# Patient Record
Sex: Female | Born: 2000 | Race: White | Hispanic: Yes | Marital: Single | Smoking: Never smoker
Health system: Southern US, Community
[De-identification: ages and names within clinical notes are randomized; demographics above are authoritative.]

---
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
## 2022-01-14 ENCOUNTER — Ambulatory Visit: Payer: PRIVATE HEALTH INSURANCE | Admitting: Dermatology

## 2022-02-19 ENCOUNTER — Ambulatory Visit (INDEPENDENT_AMBULATORY_CARE_PROVIDER_SITE_OTHER): Payer: PRIVATE HEALTH INSURANCE | Admitting: Dermatology

## 2022-02-19 ENCOUNTER — Other Ambulatory Visit: Payer: Self-pay

## 2022-02-19 DIAGNOSIS — L700 Acne vulgaris: Secondary | ICD-10-CM | POA: Diagnosis not present

## 2022-02-19 DIAGNOSIS — Z79899 Other long term (current) drug therapy: Secondary | ICD-10-CM | POA: Diagnosis not present

## 2022-02-19 MED ORDER — RETIN-A MICRO PUMP 0.08 % EX GEL: CUTANEOUS | 2 refills | Status: DC

## 2022-02-19 MED ORDER — SPIRONOLACTONE 50 MG PO TABS: 50.0000 mg | ORAL_TABLET | Freq: Every day | ORAL | 2 refills | Status: DC

## 2022-02-19 NOTE — Progress Notes (Signed)
? ?Follow-Up Visit ?  ?Subjective  ?Tara [name] is a 21 y.o. female who presents for the following: Acne (Patient here today for acne follow up at chest, back and face. Patient currently using Retin-A Micro 0.06% QHS, clindamycin/BP gel QAM, Azelex 20% cream BID and Panoxyl wash. Patient advises acne was improved but is currently flared with her period. ). ? ?Patient had some nausea and dizziness while on spironolactone 100 mg and doxycycline 20 mg.  ? ?The following portions of the chart were reviewed this encounter and updated as appropriate:  ? Allergies  Meds  Problems  Med Hx  Surg Hx  Fam Hx   ?  ? ?Review of Systems:  No other skin or systemic complaints except as noted in HPI or Assessment and Plan. ? ?Objective  ?Well appearing patient in no apparent distress; mood and affect are within normal limits. ? ?A focused examination was performed including face, neck, chest and back. Relevant physical exam findings are noted in the Assessment and Plan. ? ?face ?Face with trace open comedones, few inflammatory papules ?Back with scattered inflammatory papules ? ? ? ?Assessment & Plan  ?Acne vulgaris ?face ? ?Chronic and persistent condition with duration or expected duration over one year. Condition is bothersome/symptomatic for patient. Currently flared. ? ?BP 108/79 ? ?Start spironolactone 50 mg once daily at bedtime. Monitor for lightheadedness. ?Continue Retin-A Micro increasing to 0.08% at bedtime. ?Continue clindamycin/BP gel in the morning. ?Continue Azelex 20% cream twice daily.  ? ?Spironolactone can cause increased urination and cause blood pressure to decrease. Please watch for signs of lightheadedness and be cautious when changing position. It can sometimes cause breast tenderness or an irregular period in premenopausal women. It can also increase potassium. The increase in potassium usually is not a concern unless you are taking other medicines that also increase potassium, so please  be sure your doctor knows all of the other medications you are taking. This medication should not be taken by pregnant women.  This medicine should also not be taken together with sulfa drugs like Bactrim (trimethoprim/sulfamethexazole).  ? ?Topical retinoid medications like tretinoin/Retin-A, adapalene/Differin, tazarotene/Fabior, and Epiduo/Epiduo Forte can cause dryness and irritation when first started. Only apply a pea-sized amount to the entire affected area. Avoid applying it around the eyes, edges of mouth and creases at the nose. If you experience irritation, use a good moisturizer first and/or apply the medicine less often. If you are doing well with the medicine, you can increase how often you use it until you are applying every night. Be careful with sun protection while using this medication as it can make you sensitive to the sun. This medicine should not be used by pregnant women.  ? ? ?Tretinoin Microsphere (RETIN-A MICRO PUMP) 0.08 % GEL - face ?Apply nightly to entire face as tolerated. ? ?spironolactone (ALDACTONE) 50 MG tablet - face ?Take 1 tablet (50 mg total) by mouth daily. ? ?Related Medications ?AZELEX 20 % cream ?Apply topically 2 (two) times daily. After skin is thoroughly washed and patted dry, gently but thoroughly massage a thin film of azelaic acid cream into the affected area in the morning ? ?Clindamycin-Benzoyl Per, Refr, (DUAC) gel ?Apply a thin coat to the entire face QAM. ? ? ?Return in about 3 months (around 05/22/2022) for acne. ? ?Graciella Belton, RMA, am acting as scribe for Forest Gleason, MD . ? ?Documentation: I have reviewed the above documentation for accuracy and completeness, and I agree with the above. ? ?VIRGINA Casi Westerfeld,  MD ? ? ?

## 2022-02-19 NOTE — Patient Instructions (Addendum)
Start spironolactone 50 mg once daily at bedtime.  ?Continue Retin-A Micro increasing to 0.08% at bedtime. ?Continue clindamycin/BP gel in the morning. ?Continue Azelex 20% cream twice daily.  ? ?Spironolactone can cause increased urination and cause blood pressure to decrease. Please watch for signs of lightheadedness and be cautious when changing position. It can sometimes cause breast tenderness or an irregular period in premenopausal women. It can also increase potassium. The increase in potassium usually is not a concern unless you are taking other medicines that also increase potassium, so please be sure your doctor knows all of the other medications you are taking. This medication should not be taken by pregnant women.  This medicine should also not be taken together with sulfa drugs like Bactrim (trimethoprim/sulfamethexazole).  ? ?Topical retinoid medications like tretinoin/Retin-A, adapalene/Differin, tazarotene/Fabior, and Epiduo/Epiduo Forte can cause dryness and irritation when first started. Only apply a pea-sized amount to the entire affected area. Avoid applying it around the eyes, edges of mouth and creases at the nose. If you experience irritation, use a good moisturizer first and/or apply the medicine less often. If you are doing well with the medicine, you can increase how often you use it until you are applying every night. Be careful with sun protection while using this medication as it can make you sensitive to the sun. This medicine should not be used by pregnant women.  ? ?If You Need Anything After Your Visit ? ?If you have any questions or concerns for your doctor, please call our main line at [phone] and press option 4 to reach your doctor's medical assistant. If no one answers, please leave a voicemail as directed and we will return your call as soon as possible. Messages left after 4 pm will be answered the following business day.  ? ?You may also send [place] a message via MyChart. We  typically respond to MyChart messages within 1-2 business days. ? ?For prescription refills, please ask your pharmacy to contact our office. Our fax number is [phone]. ? ?If you have an urgent issue when the clinic is closed that cannot wait until the next business day, you can page your doctor at the number below.   ? ?Please note that while we do our best to be available for urgent issues outside of office hours, we are not available 24/7.  ? ?If you have an urgent issue and are unable to reach [place], you may choose to seek medical care at your doctor's office, retail clinic, urgent care center, or emergency room. ? ?If you have a medical emergency, please immediately call 911 or go to the emergency department. ? ?Pager Numbers ? ?- Dr. Gwen Pounds: [phone] ? ?- Dr. Neale Burly: [phone] ? ?- Dr. Roseanne Reno: [phone] ? ?In the event of inclement weather, please call our main line at [phone] for an update on the status of any delays or closures. ? ?Dermatology Medication Tips: ?Please keep the boxes that topical medications come in in order to help keep track of the instructions about where and how to use these. Pharmacies typically print the medication instructions only on the boxes and not directly on the medication tubes.  ? ?If your medication is too expensive, please contact our office at [phone] option 4 or send [place] a message through MyChart.  ? ?We are unable to tell what your co-pay for medications will be in advance as this is different depending on your insurance coverage. However, we may be able to find a substitute medication at lower  cost or fill out paperwork to get insurance to cover a needed medication.  ? ?If a prior authorization is required to get your medication covered by your insurance company, please allow [place] 1-2 business days to complete this process. ? ?Drug prices often vary depending on where the prescription is filled and some pharmacies may offer cheaper prices. ? ?The  website www.goodrx.com contains coupons for medications through different pharmacies. The prices here do not account for what the cost may be with help from insurance (it may be cheaper with your insurance), but the website can give you the price if you did not use any insurance.  ?- You can print the associated coupon and take it with your prescription to the pharmacy.  ?- You may also stop by our office during regular business hours and pick up a GoodRx coupon card.  ?- If you need your prescription sent electronically to a different pharmacy, notify our office through [facility] or by phone at [phone] option 4. ? ? ? ? ?Si Usted Necesita Algo Despu?s de Su Visita ? ?Tambi?n puede enviarnos un mensaje a trav?s de MyChart. Por lo general respondemos a los mensajes de MyChart en el transcurso de 1 a 2 d?as h?biles. ? ?Para renovar recetas, por favor pida a su farmacia que se ponga en contacto con nuestra oficina. Nuestro n?mero de fax es el [phone]. ? ?Si tiene un asunto urgente cuando la cl?nica est? cerrada y que no puede esperar hasta el siguiente d?a h?bil, puede llamar/localizar a su doctor(a) al n?mero que aparece a continuaci?n.  ? ?Por favor, tenga en cuenta que aunque hacemos todo lo posible para estar disponibles para asuntos urgentes fuera del horario de oficina, no estamos disponibles las 24 horas del d?a, los 7 d?as de la semana.  ? ?Si tiene un problema urgente y no puede comunicarse con nosotros, puede optar por buscar atenci?n m?dica  en el consultorio de su doctor(a), en una cl?nica privada, en un centro de atenci?n urgente o en una sala de emergencias. ? ?Si tiene Radio broadcast assistant m?dica, por favor llame inmediatamente al 911 o vaya a la sala de emergencias. ? ?N?meros de b?per ? ?- Dr. Gwen Pounds: [phone] ? ?- Dra. Moye: [phone] ? ?- Dra. Roseanne Reno: [phone] ? ?En caso de inclemencias del tiempo, por favor llame a nuestra l?nea principal al [phone] para una  actualizaci?n sobre el estado de cualquier retraso o cierre. ? ?Consejos para la medicaci?n en dermatolog?a: ?Por favor, guarde las cajas en las que vienen los medicamentos de uso t?pico para ayudarle a seguir las instrucciones sobre d?nde y c?mo usarlos. Las farmacias generalmente imprimen las instrucciones del medicamento s?lo en las cajas y no directamente en los tubos del [place].  ? ?Si su medicamento es muy caro, por favor, p?ngase en contacto con Rolm Gala llamando al [phone] y presione la opci?n 4 o env?enos un mensaje a trav?s de MyChart.  ? ?No podemos decirle cu?l ser? su copago por los medicamentos por adelantado ya que esto es diferente dependiendo de la cobertura de su seguro. Sin embargo, es posible que podamos encontrar un medicamento sustituto a Audiological scientist un formulario para que el seguro cubra el medicamento que se considera necesario.  ? ?Si se requiere Neomia Dear autorizaci?n previa para que su compa??a de seguros [place] su medicamento, por favor perm?tanos de 1 a 2 d?as h?biles para completar este proceso. ? ?Los precios de los medicamentos var?an con frecuencia dependiendo del Environmental consultant de d?nde se surte  la receta y alguna farmacias pueden ofrecer precios m?s baratos. ? ?El sitio web www.goodrx.com tiene cupones para medicamentos de Airline pilot. Los precios aqu? no tienen en cuenta lo que podr?a costar con la ayuda del seguro (puede ser m?s barato con su seguro), pero el sitio web puede darle el precio si no utiliz? ning?n seguro.  ?- Puede imprimir el cup?n correspondiente y llevarlo con su receta a la farmacia.  ?- Tambi?n puede pasar por nuestra oficina durante el horario de atenci?n regular y recoger una tarjeta de cupones de GoodRx.  ?- Si necesita que su receta se env?e electr?nicamente a Chiropodist, informe a nuestra oficina a trav?s de MyChart de [place] o por tel?fono llamando al [phone] y presione la opci?n 4. ? ?

## 2022-02-22 ENCOUNTER — Encounter: Payer: Self-pay | Admitting: Dermatology

## 2022-02-24 ENCOUNTER — Other Ambulatory Visit: Payer: Self-pay | Admitting: Dermatology

## 2022-02-24 DIAGNOSIS — L700 Acne vulgaris: Secondary | ICD-10-CM

## 2022-05-25 ENCOUNTER — Ambulatory Visit (INDEPENDENT_AMBULATORY_CARE_PROVIDER_SITE_OTHER): Payer: Medicaid Other | Admitting: Dermatology

## 2022-05-25 VITALS — BP 120/66 | HR 71

## 2022-05-25 DIAGNOSIS — L700 Acne vulgaris: Secondary | ICD-10-CM | POA: Diagnosis not present

## 2022-05-25 MED ORDER — DOXYCYCLINE MONOHYDRATE 100 MG PO CAPS: ORAL_CAPSULE | ORAL | 3 refills | Status: DC

## 2022-05-25 MED ORDER — SPIRONOLACTONE 100 MG PO TABS: 100.0000 mg | ORAL_TABLET | Freq: Every day | ORAL | 3 refills | Status: DC

## 2022-05-25 NOTE — Patient Instructions (Addendum)
Benzoyl peroxide can cause dryness and irritation of the skin. It can also bleach fabric. When used together with Aczone (dapsone) cream, it can stain the skin orange.  Doxycycline should be taken with food to prevent nausea. Do not lay down for 30 minutes after taking. Be cautious with sun exposure and use good sun protection while on this medication. Pregnant women should not take this medication.   Spironolactone can cause increased urination and cause blood pressure to decrease. Please watch for signs of lightheadedness and be cautious when changing position. It can sometimes cause breast tenderness or an irregular period in premenopausal women. It can also increase potassium. The increase in potassium usually is not a concern unless you are taking other medicines that also increase potassium, so please be sure your doctor knows all of the other medications you are taking. This medication should not be taken by pregnant women.  This medicine should also not be taken together with sulfa drugs like Bactrim (trimethoprim/sulfamethexazole).   Topical retinoid medications like tretinoin/Retin-A, adapalene/Differin, tazarotene/Fabior, and Epiduo/Epiduo Forte can cause dryness and irritation when first started. Only apply a pea-sized amount to the entire affected area. Avoid applying it around the eyes, edges of mouth and creases at the nose. If you experience irritation, use a good moisturizer first and/or apply the medicine less often. If you are doing well with the medicine, you can increase how often you use it until you are applying every night. Be careful with sun protection while using this medication as it can make you sensitive to the sun. This medicine should not be used by pregnant women.     If You Need Anything After Your Visit  If you have any questions or concerns for your doctor, please call our main line at [phone] and press option 4 to reach your doctor's medical assistant. If no one  answers, please leave a voicemail as directed and we will return your call as soon as possible. Messages left after 4 pm will be answered the following business day.   You may also send [place] a message via MyChart. We typically respond to MyChart messages within 1-2 business days.  For prescription refills, please ask your pharmacy to contact our office. Our fax number is [phone].  If you have an urgent issue when the clinic is closed that cannot wait until the next business day, you can page your doctor at the number below.    Please note that while we do our best to be available for urgent issues outside of office hours, we are not available 24/7.   If you have an urgent issue and are unable to reach [place], you may choose to seek medical care at your doctor's office, retail clinic, urgent care center, or emergency room.  If you have a medical emergency, please immediately call 911 or go to the emergency department.  Pager Numbers  - Dr. Gwen Pounds: [phone]  - Dr. Neale Burly: [phone]  - Dr. Roseanne Reno: [phone]  In the event of inclement weather, please call our main line at [phone] for an update on the status of any delays or closures.  Dermatology Medication Tips: Please keep the boxes that topical medications come in in order to help keep track of the instructions about where and how to use these. Pharmacies typically print the medication instructions only on the boxes and not directly on the medication tubes.   If your medication is too expensive, please contact our office at [phone] option 4 or send [place]  a message through [place].   We are unable to tell what your co-pay for medications will be in advance as this is different depending on your insurance coverage. However, we may be able to find a substitute medication at lower cost or fill out paperwork to get insurance to cover a needed medication.   If a prior authorization is required to get your medication covered  by your insurance company, please allow [place] 1-2 business days to complete this process.  Drug prices often vary depending on where the prescription is filled and some pharmacies may offer cheaper prices.  The website www.goodrx.com contains coupons for medications through different pharmacies. The prices here do not account for what the cost may be with help from insurance (it may be cheaper with your insurance), but the website can give you the price if you did not use any insurance.  - You can print the associated coupon and take it with your prescription to the pharmacy.  - You may also stop by our office during regular business hours and pick up a GoodRx coupon card.  - If you need your prescription sent electronically to a different pharmacy, notify our office through [facility] or by phone at [phone] option 4.     Si Usted Necesita Algo Despus de Su Visita  Tambin puede enviarnos un mensaje a travs de Pharmacist, community. Por lo general respondemos a los mensajes de MyChart en el transcurso de 1 a 2 das hbiles.  Para renovar recetas, por favor pida a su farmacia que se ponga en contacto con nuestra oficina. Harland Dingwall de fax es [place] [phone].  Si tiene un asunto urgente cuando la clnica est cerrada y que no puede esperar hasta el siguiente da hbil, puede llamar/localizar a su doctor(a) al nmero que aparece a continuacin.   Por favor, tenga en cuenta que aunque hacemos todo lo posible para estar disponibles para asuntos urgentes fuera del horario de War, no estamos disponibles las 24 horas del da, los 7 das de la [place].   Si tiene un problema urgente y no puede comunicarse con nosotros, puede optar por buscar atencin mdica  en el consultorio de su doctor(a), en una clnica privada, en un centro de atencin urgente o en una sala de emergencias.  Si tiene Engineering geologist, por favor llame inmediatamente al 911 o vaya a la sala de emergencias.  Nmeros de  bper  - Dr. Nehemiah Massed: [phone]  - Dra. Moye: [phone]  - Dra. Nicole Kindred: [phone]  En caso de inclemencias del [place], por favor llame a Johnsie Kindred principal al [phone] para una actualizacin sobre el [place] de cualquier retraso o cierre.  Consejos para la medicacin en dermatologa: Por favor, guarde las cajas en las que vienen los medicamentos de uso tpico para ayudarle a seguir las instrucciones sobre dnde y cmo usarlos. Las farmacias generalmente imprimen las instrucciones del medicamento slo en las cajas y no directamente en los tubos del [place].   Si su medicamento es muy caro, por favor, pngase en contacto con Zigmund Daniel llamando al [phone] y presione la opcin 4 o envenos un mensaje a travs de Pharmacist, community.   No podemos decirle cul ser su copago por los medicamentos por adelantado ya que esto es diferente dependiendo de la cobertura de su seguro. Sin embargo, es posible que podamos encontrar un medicamento sustituto a Electrical engineer un formulario para que el seguro cubra el medicamento que se considera necesario.   Si se requiere  una autorizacin previa para que su compaa de seguros [place] su medicamento, por favor permtanos de 1 a 2 das hbiles para completar este proceso.  Los precios de los medicamentos varan con frecuencia dependiendo del Environmental consultant de dnde se surte la receta y alguna farmacias pueden ofrecer precios ms baratos.  El sitio web www.goodrx.com tiene cupones para medicamentos de Airline pilot. Los precios aqu no tienen en cuenta lo que podra costar con la ayuda del seguro (puede ser ms barato con su seguro), pero el sitio web puede darle el precio si no utiliz Research scientist (physical sciences).  - Puede imprimir el cupn correspondiente y llevarlo con su receta a la farmacia.  - Tambin puede pasar por nuestra oficina durante el horario de atencin regular y Charity fundraiser una tarjeta de cupones de GoodRx.  - Si necesita que su receta se  enve electrnicamente a una farmacia diferente, informe a nuestra oficina a travs de MyChart de [place] o por telfono llamando al [phone] y presione la opcin 4.

## 2022-05-25 NOTE — Progress Notes (Signed)
Follow-Up Visit   Subjective  Tara [name] is a 21 y.o. female who presents for the following: Acne (Patient currently using Spironolactone 50mg  po QD, Retin A 0.08% QHS, Clindamycin/BP gel QAM, and Azelex 20% BID. Patient tolerating medications well with no s/e. Patient continues to flare off and on especially around menses).    The following portions of the chart were reviewed this encounter and updated as appropriate:       Review of Systems:  No other skin or systemic complaints except as noted in HPI or Assessment and Plan.  Objective  Well appearing patient in no apparent distress; mood and affect are within normal limits.  A focused examination was performed including the face, chest, and back. Relevant physical exam findings are noted in the Assessment and Plan.  Face Multiple inflamed comedones on the forehead, cheeks, and chin. Few scattered inflammatory papules on the temples and cheeks. Violaceous papules on the back with one pustule and scattered comedones.    Assessment & Plan  Acne vulgaris Face  Chronic and persistent condition with duration or expected duration over one year. Condition is bothersome/symptomatic for patient. Currently flared.  Increase Spironolactone to 100mg  po QD. Spironolactone can cause increased urination and cause blood pressure to decrease. Please watch for signs of lightheadedness and be cautious when changing position. It can sometimes cause breast tenderness or an irregular period in premenopausal women. It can also increase potassium. The increase in potassium usually is not a concern unless you are taking other medicines that also increase potassium, so please be sure your doctor knows all of the other medications you are taking. This medication should not be taken by pregnant women.  This medicine should also not be taken together with sulfa drugs like Bactrim (trimethoprim/sulfamethexazole).   Continue Azelex cream  QD/BID.    Continue Duac gel QAM. Benzoyl peroxide can cause dryness and irritation of the skin. It can also bleach fabric. When used together with Aczone (dapsone) cream, it can stain the skin orange.   Continue Retin A 0.08% gel QHS. Topical retinoid medications like tretinoin/Retin-A, adapalene/Differin, tazarotene/Fabior, and Epiduo/Epiduo Forte can cause dryness and irritation when first started. Only apply a pea-sized amount to the entire affected area. Avoid applying it around the eyes, edges of mouth and creases at the nose. If you experience irritation, use a good moisturizer first and/or apply the medicine less often. If you are doing well with the medicine, you can increase how often you use it until you are applying every night. Be careful with sun protection while using this medication as it can make you sensitive to the sun. This medicine should not be used by pregnant women.   Start Doxycycline 100mg  po QD. Doxycycline should be taken with food to prevent nausea. Do not lay down for 30 minutes after taking. Be cautious with sun exposure and use good sun protection while on this medication. Pregnant women should not take this medication.   Continue Panoxyl OTC wash daily in the shower.   Consider Isotretinoin ion f/up if not improving with adding Doxycycline and increasing Spironolactone.  spironolactone (ALDACTONE) 100 MG tablet - Face Take 1 tablet (100 mg total) by mouth daily.  doxycycline (MONODOX) 100 MG capsule - Face Take one cap po QHS with food.  Related Medications AZELEX 20 % cream Apply topically 2 (two) times daily. After skin is thoroughly washed and patted dry, gently but thoroughly massage a thin film of azelaic acid cream into the affected area in  the morning  Clindamycin-Benzoyl Per, Refr, (DUAC) gel Apply a thin coat to the entire face QAM.  Tretinoin Microsphere (RETIN-A MICRO PUMP) 0.08 % GEL Apply nightly to entire face as tolerated.   Return in about 10  weeks (around 08/03/2022) for acne follow up .  Maylene Roes, CMA, am acting as scribe for Willeen Niece, MD .  Documentation: I have reviewed the above documentation for accuracy and completeness, and I agree with the above.  Willeen Niece MD

## 2022-07-27 ENCOUNTER — Ambulatory Visit: Payer: Medicaid Other | Admitting: Dermatology

## 2022-11-25 ENCOUNTER — Encounter: Payer: Self-pay | Admitting: Obstetrics and Gynecology

## 2022-11-25 ENCOUNTER — Ambulatory Visit (INDEPENDENT_AMBULATORY_CARE_PROVIDER_SITE_OTHER): Payer: Medicaid Other | Admitting: Obstetrics and Gynecology

## 2022-11-25 VITALS — BP 119/78 | HR 62 | Ht 66.0 in | Wt 168.6 lb

## 2022-11-25 DIAGNOSIS — N921 Excessive and frequent menstruation with irregular cycle: Secondary | ICD-10-CM

## 2022-11-25 DIAGNOSIS — Z7689 Persons encountering health services in other specified circumstances: Secondary | ICD-10-CM

## 2022-11-25 DIAGNOSIS — N946 Dysmenorrhea, unspecified: Secondary | ICD-10-CM | POA: Diagnosis not present

## 2022-11-25 MED ORDER — DESOGESTREL-ETHINYL ESTRADIOL 0.15-0.02/0.01 MG (21/5) PO TABS: 1.0000 | ORAL_TABLET | Freq: Every day | ORAL | 3 refills | Status: DC

## 2022-11-25 NOTE — Progress Notes (Signed)
Patient presents today to discuss menorrhagia. She states over the past three years she has had two menstrual cycles per month, very heavy along with sever cramping. Patient has never been on birth control. No additional concerns.

## 2022-11-25 NOTE — Progress Notes (Signed)
HPI:      [name] is a 21 y.o. G0P0000 who LMP was Patient's last menstrual period was 11/22/2022 (exact date).  Subjective:   She presents today stating that over the last 3 years she has had 2 menstrual cycles per month.  Each 1 lasts approximately 8 days.  She is understandably tired of this bleeding.  She is sexually active.  She uses condoms for birth control.    Hx: The following portions of the patient's history were reviewed and updated as appropriate:             She  has no past medical history on file. She does not have a problem list on file. She  has no past surgical history on file. Her family history is not on file. She  reports that she has never smoked. She does not have any smokeless tobacco history on file. She reports that she does not drink alcohol and does not use drugs. She has a current medication list which includes the following prescription(s): azelex, clindamycin-benzoyl per (refr), desogestrel-ethinyl estradiol, doxycycline, retin-a, spironolactone, and retin-a micro pump. She has No Known Allergies.       Review of Systems:  Review of Systems  Constitutional: Denied constitutional symptoms, night sweats, recent illness, fatigue, fever, insomnia and weight loss.  Eyes: Denied eye symptoms, eye pain, photophobia, vision change and visual disturbance.  Ears/Nose/Throat/Neck: Denied ear, nose, throat or neck symptoms, hearing loss, nasal discharge, sinus congestion and sore throat.  Cardiovascular: Denied cardiovascular symptoms, arrhythmia, chest pain/pressure, edema, exercise intolerance, orthopnea and palpitations.  Respiratory: Denied pulmonary symptoms, asthma, pleuritic pain, productive sputum, cough, dyspnea and wheezing.  Gastrointestinal: Denied, gastro-esophageal reflux, melena, nausea and vomiting.  Genitourinary: See HPI for additional information.  Musculoskeletal: Denied musculoskeletal symptoms, stiffness, swelling, muscle weakness  and myalgia.  Dermatologic: Denied dermatology symptoms, rash and scar.  Neurologic: Denied neurology symptoms, dizziness, headache, neck pain and syncope.  Psychiatric: Denied psychiatric symptoms, anxiety and depression.  Endocrine: Denied endocrine symptoms including hot flashes and night sweats.   Meds:   Current Outpatient Medications on File Prior to Visit  Medication Sig Dispense Refill   AZELEX 20 % cream Apply topically 2 (two) times daily. After skin is thoroughly washed and patted dry, gently but thoroughly massage a thin film of azelaic acid cream into the affected area in the morning 30 g 5   Clindamycin-Benzoyl Per, Refr, (DUAC) gel Apply a thin coat to the entire face QAM. 45 g 3   doxycycline (MONODOX) 100 MG capsule Take one cap po QHS with food. 30 capsule 3   RETIN-A 0.025 % cream Apply topically at bedtime. 45 g 1   spironolactone (ALDACTONE) 100 MG tablet Take 1 tablet (100 mg total) by mouth daily. 30 tablet 3   Tretinoin Microsphere (RETIN-A MICRO PUMP) 0.08 % GEL Apply nightly to entire face as tolerated. 50 g 2   No current facility-administered medications on file prior to visit.      Objective:     Vitals:   11/25/22 1135  BP: 119/78  Pulse: 62   Filed Weights   11/25/22 1135  Weight: 168 lb 9.6 oz (76.5 kg)                        Assessment:    G0P0000 There are no problems to display for this patient.    1. Establishing care with new doctor, encounter for   2. Dysmenorrhea   3.  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  OCPs for cycle control.  I have discussed the rationale for this choice in detail with the patient  OCPs The risks /benefits of OCPs have been explained to the patient in detail.  Product literature has been given to her where appropriate.  I have instructed her in the use of OCPs.  I have explained to the patient that OCPs are not as effective for birth control during  the first month of use, and that another form of contraception should be used during this time.  Both first-day start and Sunday start have been explained.  The risks and benefits of each was discussed.  She has been made aware of  the fact that in rare circumstances, other medications may affect the efficacy of OCPs.  I have answered all of her questions, and I believe that she has an understanding of the effectiveness and use of OCPs.  Orders No orders of the defined types were placed in this encounter.    Meds ordered this encounter  Medications   desogestrel-ethinyl estradiol (MIRCETTE) 0.15-0.02/0.01 MG (21/5) tablet    Sig: Take 1 tablet by mouth at bedtime.    Dispense:  28 tablet    Refill:  3      F/U  Return in about 3 months (around 02/24/2023). I spent 21 minutes involved in the care of this patient preparing to see the patient by obtaining and reviewing her medical history (including labs, imaging tests and prior procedures), documenting clinical information in the electronic health record (EHR), counseling and coordinating care plans, writing and sending prescriptions, ordering tests or procedures and in direct communicating with the patient and medical staff discussing pertinent items from her history and physical exam.  Elonda Husky, M.D. 11/25/2022 12:08 PM

## 2022-12-04 ENCOUNTER — Encounter: Payer: Medicaid Other | Admitting: Licensed Practical Nurse

## 2023-02-02 ENCOUNTER — Telehealth: Payer: Self-pay | Admitting: Obstetrics and Gynecology

## 2023-02-02 NOTE — Telephone Encounter (Signed)
Patient is rescheduled for 3/12 with DJE

## 2023-02-02 NOTE — Telephone Encounter (Signed)
Lm for patient to call office back to r/s appt on 02/24/23 with DJE he will be out of the office that week.

## 2023-02-24 ENCOUNTER — Ambulatory Visit: Payer: Medicaid Other | Admitting: Obstetrics and Gynecology

## 2023-03-02 ENCOUNTER — Encounter: Payer: Self-pay | Admitting: Obstetrics and Gynecology

## 2023-03-02 ENCOUNTER — Ambulatory Visit (INDEPENDENT_AMBULATORY_CARE_PROVIDER_SITE_OTHER): Payer: Medicaid Other | Admitting: Obstetrics and Gynecology

## 2023-03-02 VITALS — BP 117/77 | HR 67 | Ht 66.0 in | Wt 170.2 lb

## 2023-03-02 DIAGNOSIS — N921 Excessive and frequent menstruation with irregular cycle: Secondary | ICD-10-CM

## 2023-03-02 DIAGNOSIS — Z3041 Encounter for surveillance of contraceptive pills: Secondary | ICD-10-CM

## 2023-03-02 DIAGNOSIS — N946 Dysmenorrhea, unspecified: Secondary | ICD-10-CM

## 2023-03-02 MED ORDER — DESOGESTREL-ETHINYL ESTRADIOL 0.15-0.02/0.01 MG (21/5) PO TABS: 1.0000 | ORAL_TABLET | Freq: Every day | ORAL | 11 refills | Status: DC

## 2023-03-02 NOTE — Progress Notes (Signed)
HPI:      Tara [name] is a 22 y.o. G0P0000 who LMP was Patient's last menstrual period was 02/27/2023.  Subjective:   She presents today for follow-up of OCPs.  She was having heavy irregular bleeding.  She has begun taking OCPs and they are working well for her.  She now has normal regular cycles.  She is taking them daily and says it is going well taking them before bed.  She would like to continue OCPs.    Hx: The following portions of the patient's history were reviewed and updated as appropriate:             She  has no past medical history on file. She does not have a problem list on file. She  has no past surgical history on file. Her family history is not on file. She  reports that she has never smoked. She does not have any smokeless tobacco history on file. She reports that she does not drink alcohol and does not use drugs. She has a current medication list which includes the following prescription(s): azelex, clindamycin-benzoyl per (refr), doxycycline, retin-a, spironolactone, retin-a micro pump, and desogestrel-ethinyl estradiol. She has No Known Allergies.       Review of Systems:  Review of Systems  Constitutional: Denied constitutional symptoms, night sweats, recent illness, fatigue, fever, insomnia and weight loss.  Eyes: Denied eye symptoms, eye pain, photophobia, vision change and visual disturbance.  Ears/Nose/Throat/Neck: Denied ear, nose, throat or neck symptoms, hearing loss, nasal discharge, sinus congestion and sore throat.  Cardiovascular: Denied cardiovascular symptoms, arrhythmia, chest pain/pressure, edema, exercise intolerance, orthopnea and palpitations.  Respiratory: Denied pulmonary symptoms, asthma, pleuritic pain, productive sputum, cough, dyspnea and wheezing.  Gastrointestinal: Denied, gastro-esophageal reflux, melena, nausea and vomiting.  Genitourinary: Denied genitourinary symptoms including symptomatic vaginal discharge, pelvic  relaxation issues, and urinary complaints.  Musculoskeletal: Denied musculoskeletal symptoms, stiffness, swelling, muscle weakness and myalgia.  Dermatologic: Denied dermatology symptoms, rash and scar.  Neurologic: Denied neurology symptoms, dizziness, headache, neck pain and syncope.  Psychiatric: Denied psychiatric symptoms, anxiety and depression.  Endocrine: Denied endocrine symptoms including hot flashes and night sweats.   Meds:   Current Outpatient Medications on File Prior to Visit  Medication Sig Dispense Refill   AZELEX 20 % cream Apply topically 2 (two) times daily. After skin is thoroughly washed and patted dry, gently but thoroughly massage a thin film of azelaic acid cream into the affected area in the morning 30 g 5   Clindamycin-Benzoyl Per, Refr, (DUAC) gel Apply a thin coat to the entire face QAM. 45 g 3   doxycycline (MONODOX) 100 MG capsule Take one cap po QHS with food. 30 capsule 3   RETIN-A 0.025 % cream Apply topically at bedtime. 45 g 1   spironolactone (ALDACTONE) 100 MG tablet Take 1 tablet (100 mg total) by mouth daily. 30 tablet 3   Tretinoin Microsphere (RETIN-A MICRO PUMP) 0.08 % GEL Apply nightly to entire face as tolerated. 50 g 2   No current facility-administered medications on file prior to visit.      Objective:     Vitals:   03/02/23 1512  BP: 117/77  Pulse: 67   Filed Weights   03/02/23 1512  Weight: 170 lb 3.2 oz (77.2 kg)                        Assessment:    G0P0000 There are no problems to display for this patient.  1. Surveillance for birth control, oral contraceptives   2. Dysmenorrhea   3. Menorrhagia with irregular cycle     Menorrhagia dysmenorrhea now controlled-use of Mircette   Plan:            1.  Continue OCPs follow-up for annual examination in December. Orders No orders of the defined types were placed in this encounter.    Meds ordered this encounter  Medications   desogestrel-ethinyl estradiol  (MIRCETTE) 0.15-0.02/0.01 MG (21/5) tablet    Sig: Take 1 tablet by mouth at bedtime.    Dispense:  28 tablet    Refill:  11      F/U  Return in about 9 months (around 12/02/2023) for Annual Physical. I spent 16 minutes involved in the care of this patient preparing to see the patient by obtaining and reviewing her medical history (including labs, imaging tests and prior procedures), documenting clinical information in the electronic health record (EHR), counseling and coordinating care plans, writing and sending prescriptions, ordering tests or procedures and in direct communicating with the patient and medical staff discussing pertinent items from her history and physical exam.  Finis Bud, M.D. 03/02/2023 3:17 PM

## 2023-03-02 NOTE — Progress Notes (Signed)
Patient presents today to follow-up from starting OCP's. She states cycles are much better now and she would like to continue this plan. No additional questions.

## 2023-03-15 ENCOUNTER — Other Ambulatory Visit: Payer: Self-pay | Admitting: Obstetrics and Gynecology

## 2023-03-15 DIAGNOSIS — N921 Excessive and frequent menstruation with irregular cycle: Secondary | ICD-10-CM

## 2023-03-15 DIAGNOSIS — N946 Dysmenorrhea, unspecified: Secondary | ICD-10-CM

## 2023-08-15 ENCOUNTER — Inpatient Hospital Stay (HOSPITAL_COMMUNITY): Admit: 2023-08-15 | Payer: Medicaid Other | Source: Home / Self Care | Admitting: Obstetrics & Gynecology

## 2023-09-02 ENCOUNTER — Other Ambulatory Visit (HOSPITAL_COMMUNITY)
Admission: RE | Admit: 2023-09-02 | Discharge: 2023-09-02 | Disposition: A | Discharge status: Home / Self Care | Payer: Medicaid Other | Source: Ambulatory Visit

## 2023-09-02 ENCOUNTER — Ambulatory Visit (INDEPENDENT_AMBULATORY_CARE_PROVIDER_SITE_OTHER): Payer: Medicaid Other

## 2023-09-02 VITALS — BP 107/69 | HR 55 | Wt 156.2 lb

## 2023-09-02 DIAGNOSIS — Z113 Encounter for screening for infections with a predominantly sexual mode of transmission: Secondary | ICD-10-CM | POA: Diagnosis present

## 2023-09-02 DIAGNOSIS — N898 Other specified noninflammatory disorders of vagina: Secondary | ICD-10-CM | POA: Diagnosis not present

## 2023-09-02 DIAGNOSIS — Z202 Contact with and (suspected) exposure to infections with a predominantly sexual mode of transmission: Secondary | ICD-10-CM

## 2023-09-02 MED ORDER — DOXYCYCLINE HYCLATE 100 MG PO CAPS: 100.0000 mg | ORAL_CAPSULE | Freq: Two times a day (BID) | ORAL | 0 refills | Status: AC

## 2023-09-02 NOTE — Progress Notes (Signed)
   GYN ENCOUNTER  Encounter for vaginal discharge  Subjective  HPI: Tara [name] is a 22 y.o. G0P0000 who presents today for evaluation of vaginal discharge.  In last two weeks, she has noticed a whitish green vaginal discharge. Her partner recently tested positive for chlamydia and has been treated but she has not yet been treated. She desires STI screening today.  She is currently using COCs and having regularly cycles on the pills. Her LMP was 08/18/23.  No past medical history on file. No past surgical history on file. OB History     Gravida  0   Para  0   Term  0   Preterm  0   AB  0   Living  0      SAB  0   IAB  0   Ectopic  0   Multiple  0   Live Births  0          No Known Allergies  Review of Systems  12 point ROS negative except for pertinent positives noted in HPO above.   Objective  BP 107/69   Pulse (!) 55   Wt 156 lb 3.2 oz (70.9 kg)   LMP 08/18/2023 (Exact Date)   BMI 25.21 kg/m   Physical examination GENERAL APPEARANCE: alert, well appearing LUNGS: normal work of breathing HEART: normal heart rate  Assessment/Plan - High likelihood symptoms are related to chlamydia. Given partner's recent positive test, provided doxycycline Rx and reviewed precautions fro preventing re-transmission.  - Vaginal swab collected today for GC/CT, Trich, BV, and yeast. Declines blood work for other STI screening.  - Recommended follow up at annual visit in 3 months for re-testing.   Lindalou Hose Icis Budreau, CNM  09/02/23 2:27 PM

## 2023-09-06 LAB — CERVICOVAGINAL ANCILLARY ONLY
Bacterial Vaginitis (gardnerella): NEGATIVE
Candida Glabrata: NEGATIVE
Candida Vaginitis: NEGATIVE
Chlamydia: NEGATIVE
Comment: NEGATIVE
Comment: NEGATIVE
Comment: NEGATIVE
Comment: NEGATIVE
Comment: NEGATIVE
Comment: NORMAL
Neisseria Gonorrhea: NEGATIVE
Trichomonas: NEGATIVE

## 2023-11-16 ENCOUNTER — Telehealth: Payer: Self-pay

## 2023-11-16 NOTE — Telephone Encounter (Signed)
Pt calling; was told to schedule a f/u STI and see DJE about her bcp. [phone]

## 2023-11-23 NOTE — Telephone Encounter (Signed)
Pt is scheduled with Dr. Logan Bores on 12/07/2023 for her annual, TOC, and bcp.

## 2023-12-07 ENCOUNTER — Encounter: Payer: Self-pay | Admitting: Obstetrics and Gynecology

## 2023-12-07 ENCOUNTER — Ambulatory Visit (INDEPENDENT_AMBULATORY_CARE_PROVIDER_SITE_OTHER): Payer: Medicaid Other | Admitting: Obstetrics and Gynecology

## 2023-12-07 VITALS — BP 105/72 | HR 45 | Ht 66.0 in | Wt 145.5 lb

## 2023-12-07 DIAGNOSIS — Z113 Encounter for screening for infections with a predominantly sexual mode of transmission: Secondary | ICD-10-CM

## 2023-12-07 DIAGNOSIS — N946 Dysmenorrhea, unspecified: Secondary | ICD-10-CM

## 2023-12-07 DIAGNOSIS — Z01419 Encounter for gynecological examination (general) (routine) without abnormal findings: Secondary | ICD-10-CM

## 2023-12-07 DIAGNOSIS — N921 Excessive and frequent menstruation with irregular cycle: Secondary | ICD-10-CM

## 2023-12-07 DIAGNOSIS — Z3041 Encounter for surveillance of contraceptive pills: Secondary | ICD-10-CM

## 2023-12-07 MED ORDER — DESOGESTREL-ETHINYL ESTRADIOL 0.15-0.02/0.01 MG (21/5) PO TABS: 1.0000 | ORAL_TABLET | Freq: Every day | ORAL | 11 refills | Status: DC

## 2023-12-07 NOTE — Progress Notes (Signed)
Patients presents for annual exam today. She states doing well with current OCP. GC/CT culture for STD screening. Due for pap smear next year. She states no other questions or concerns at this time.

## 2023-12-07 NOTE — Progress Notes (Signed)
HPI:      [name] is a 22 y.o. G0P0000 who LMP was Patient's last menstrual period was 12/02/2023 (exact date).  Subjective:   She presents today for her annual examination.  She has been taking Mircette for birth control.  She has normal regular cycles.  She reports that she is not missing pills. In September her boyfriend was diagnosed with chlamydia.  She was tested and found to be negative.  She will undergo standard GC/CT testing today.    Hx: The following portions of the patient's history were reviewed and updated as appropriate:             She  has no past medical history on file. She does not have a problem list on file. She  has no past surgical history on file. Her family history is not on file. She  reports that she has never smoked. She does not have any smokeless tobacco history on file. She reports that she does not drink alcohol and does not use drugs. She has a current medication list which includes the following prescription(s): doxycycline, spironolactone, retin-a micro pump, and desogestrel-ethinyl estradiol. She has no known allergies.       Review of Systems:  Review of Systems  Constitutional: Denied constitutional symptoms, night sweats, recent illness, fatigue, fever, insomnia and weight loss.  Eyes: Denied eye symptoms, eye pain, photophobia, vision change and visual disturbance.  Ears/Nose/Throat/Neck: Denied ear, nose, throat or neck symptoms, hearing loss, nasal discharge, sinus congestion and sore throat.  Cardiovascular: Denied cardiovascular symptoms, arrhythmia, chest pain/pressure, edema, exercise intolerance, orthopnea and palpitations.  Respiratory: Denied pulmonary symptoms, asthma, pleuritic pain, productive sputum, cough, dyspnea and wheezing.  Gastrointestinal: Denied, gastro-esophageal reflux, melena, nausea and vomiting.  Genitourinary: Denied genitourinary symptoms including symptomatic vaginal discharge, pelvic relaxation  issues, and urinary complaints.  Musculoskeletal: Denied musculoskeletal symptoms, stiffness, swelling, muscle weakness and myalgia.  Dermatologic: Denied dermatology symptoms, rash and scar.  Neurologic: Denied neurology symptoms, dizziness, headache, neck pain and syncope.  Psychiatric: Denied psychiatric symptoms, anxiety and depression.  Endocrine: Denied endocrine symptoms including hot flashes and night sweats.   Meds:   Current Outpatient Medications on File Prior to Visit  Medication Sig Dispense Refill   doxycycline (MONODOX) 100 MG capsule Take one cap po QHS with food. 30 capsule 3   spironolactone (ALDACTONE) 100 MG tablet Take 1 tablet (100 mg total) by mouth daily. 30 tablet 3   Tretinoin Microsphere (RETIN-A MICRO PUMP) 0.08 % GEL Apply nightly to entire face as tolerated. 50 g 2   No current facility-administered medications on file prior to visit.     Objective:     Vitals:   12/07/23 0921  BP: 105/72  Pulse: (!) 45    Filed Weights   12/07/23 0921  Weight: 145 lb 8 oz (66 kg)              She has deferred her physical exam today.  Assessment:    G0P0000 There are no active problems to display for this patient.    1. Well woman exam with routine gynecological exam   2. Surveillance for birth control, oral contraceptives   3. Screening for STD (sexually transmitted disease)   4. Dysmenorrhea   5. Menorrhagia with irregular cycle     Patient doing well on OCPs.  Recent history of chlamydia exposure with subsequent negative culture.   Plan:            1.  Basic Screening  Recommendations The basic screening recommendations for asymptomatic women were discussed with the patient during her visit.  The age-appropriate recommendations were discussed with her and the rational for the tests reviewed.  When I am informed by the patient that another primary care physician has previously obtained the age-appropriate tests and they are up-to-date, only  outstanding tests are ordered and referrals given as necessary.  Abnormal results of tests will be discussed with her when all of her results are completed.  Routine preventative health maintenance measures emphasized: Exercise/Diet/Weight control, Tobacco Warnings, Alcohol/Substance use risks and Stress Management GC/CT today -Pap next year 2.  Continue OCPs. Orders Orders Placed This Encounter  Procedures   GC/Chlamydia Probe Amp     Meds ordered this encounter  Medications   desogestrel-ethinyl estradiol (MIRCETTE) 0.15-0.02/0.01 MG (21/5) tablet    Sig: Take 1 tablet by mouth at bedtime.    Dispense:  28 tablet    Refill:  11            F/U  No follow-ups on file.  Tara [name], M.D. 12/07/2023 9:29 AM

## 2023-12-08 ENCOUNTER — Other Ambulatory Visit: Payer: Self-pay | Admitting: Obstetrics and Gynecology

## 2023-12-08 DIAGNOSIS — N946 Dysmenorrhea, unspecified: Secondary | ICD-10-CM

## 2023-12-08 DIAGNOSIS — N921 Excessive and frequent menstruation with irregular cycle: Secondary | ICD-10-CM

## 2023-12-08 NOTE — Telephone Encounter (Signed)
Year long refill sent in 12/17

## 2023-12-09 LAB — GC/CHLAMYDIA PROBE AMP
Chlamydia trachomatis, NAA: NEGATIVE
Neisseria Gonorrhoeae by PCR: NEGATIVE

## 2024-02-29 ENCOUNTER — Encounter (HOSPITAL_BASED_OUTPATIENT_CLINIC_OR_DEPARTMENT_OTHER): Payer: Self-pay | Admitting: Emergency Medicine

## 2024-02-29 ENCOUNTER — Other Ambulatory Visit: Payer: Self-pay

## 2024-02-29 DIAGNOSIS — L509 Urticaria, unspecified: Secondary | ICD-10-CM | POA: Diagnosis not present

## 2024-02-29 DIAGNOSIS — R21 Rash and other nonspecific skin eruption: Secondary | ICD-10-CM | POA: Diagnosis present

## 2024-02-29 NOTE — ED Triage Notes (Signed)
 Pt c/o generalized rash first noticed yesterday, starting at bilateral hips and spreading to other parts of her torso. States the affected areas are burning. Denies allergies. No changes to soap, detergent, personal care items, etc. Pt states all vaccines are UTD.

## 2024-03-01 ENCOUNTER — Emergency Department (HOSPITAL_BASED_OUTPATIENT_CLINIC_OR_DEPARTMENT_OTHER)
Admission: EM | Admit: 2024-03-01 | Discharge: 2024-03-01 | Disposition: A | Discharge status: Home / Self Care | Attending: Emergency Medicine | Admitting: Emergency Medicine

## 2024-03-01 ENCOUNTER — Ambulatory Visit (HOSPITAL_COMMUNITY)

## 2024-03-01 DIAGNOSIS — R21 Rash and other nonspecific skin eruption: Secondary | ICD-10-CM

## 2024-03-01 MED ORDER — HYDROXYZINE HCL 25 MG PO TABS
25.0000 mg | ORAL_TABLET | Freq: Once | ORAL | Status: AC
  Administered 2024-03-01: 25 mg via ORAL
  Filled 2024-03-01: qty 1

## 2024-03-01 MED ORDER — PREDNISONE 10 MG PO TABS: 20.0000 mg | ORAL_TABLET | Freq: Two times a day (BID) | ORAL | 0 refills | Status: DC

## 2024-03-01 MED ORDER — HYDROXYZINE HCL 25 MG PO TABS: 25.0000 mg | ORAL_TABLET | Freq: Four times a day (QID) | ORAL | 0 refills | Status: DC

## 2024-03-01 MED ORDER — PREDNISONE 20 MG PO TABS
40.0000 mg | ORAL_TABLET | Freq: Once | ORAL | Status: AC
  Administered 2024-03-01: 40 mg via ORAL
  Filled 2024-03-01: qty 2

## 2024-03-01 NOTE — Discharge Instructions (Addendum)
Begin taking prednisone and hydroxyzine as prescribed.  Follow-up with primary doctor if not improving in the next few days, and return to the ER if symptoms significantly worsen or change.

## 2024-03-01 NOTE — ED Provider Notes (Signed)
  [place] [facility] Provider Note   CSN: [id number] Arrival date & time: 02/29/24  2207     History  Chief Complaint  Patient presents with   Rash    Tara [name] is a 23 y.o. female.  Patient is a 23 year old female with no significant past medical history.  Patient presenting today for evaluation of rash.  For the past several days, she has had a rash that began in her groin, then spread to her back and arms.  She denies any new contacts or exposures.  No fevers or chills.  No recent illness.  She does report recently traveling to [place] to visit family.  The history is provided by the patient.       Home Medications Prior to Admission medications   Medication Sig Start Date End Date Taking? Authorizing Provider  desogestrel-ethinyl estradiol (MIRCETTE) 0.15-0.02/0.01 MG (21/5) tablet Take 1 tablet by mouth at bedtime. 12/07/23   Linzie Collin, MD  doxycycline (MONODOX) 100 MG capsule Take one cap po QHS with food. 05/25/22   Willeen Niece, MD  spironolactone (ALDACTONE) 100 MG tablet Take 1 tablet (100 mg total) by mouth daily. 05/25/22   Willeen Niece, MD  Tretinoin Microsphere (RETIN-A MICRO PUMP) 0.08 % GEL Apply nightly to entire face as tolerated. 02/19/22   Moy[place], [place], MD      Allergies    Patient has no known allergies.    Review of Systems   Review of Systems  All other systems reviewed and are negative.   Physical Exam Updated Vital Signs BP (!) 117/93 (BP Location: Right Arm)   Pulse 96   Temp 98.8 F (37.1 C) (Oral)   Resp 17   Ht 5\' 6"  (1.676 m)   Wt 67.1 kg   LMP 02/21/2024 (Exact Date)   SpO2 98%   BMI 23.89 kg/m  Physical Exam Vitals and nursing note reviewed.  Constitutional:      Appearance: Normal appearance.  HENT:     Head: Normocephalic.  Pulmonary:     Effort: Pulmonary effort is normal.  Skin:    General: Skin is warm and dry.     Comments: There is an urticarial rash noted to  the groin, left greater than right and extending upper back near.  Neurological:     Mental Status: She is alert and oriented to person, place, and time.     ED Results / Procedures / Treatments   Labs (all labs ordered are listed, but only abnormal results are displayed) Labs Reviewed - No data to display  EKG None  Radiology No results found.  Procedures Procedures    Medications Ordered in ED Medications - No data to display  ED Course/ Medical Decision Making/ A&P  Patient presenting with rash, likely allergic in nature.  I will treat with prednisone and hydroxyzine.  Patient to follow-up/return as needed.  Final Clinical Impression(s) / ED Diagnoses Final diagnoses:  None    Rx / DC Orders ED Discharge Orders     None         Geoffery Lyons, MD 03/01/24 [id number]

## 2024-03-02 ENCOUNTER — Telehealth: Payer: Self-pay

## 2024-03-02 NOTE — Telephone Encounter (Signed)
 TRIAGE VOICEMAIL: Patient requesting appointment for STI screening for Chlamydia.

## 2024-03-02 NOTE — Telephone Encounter (Signed)
 Left voicemail advising to return call and select option for scheduling to get appointment scheduled.

## 2024-03-03 NOTE — Telephone Encounter (Signed)
 Patient called back in and stated that she wasn't going to do the STD testing.

## 2024-03-03 NOTE — Telephone Encounter (Signed)
 Reached out to pt to schedule Nurse visit (STD testing).  Left message for pt to call back.

## 2024-05-11 ENCOUNTER — Ambulatory Visit (INDEPENDENT_AMBULATORY_CARE_PROVIDER_SITE_OTHER): Admitting: Family Medicine

## 2024-05-11 VITALS — BP 112/68 | HR 52 | Temp 97.6°F | Ht 66.0 in | Wt 144.0 lb

## 2024-05-11 DIAGNOSIS — R21 Rash and other nonspecific skin eruption: Secondary | ICD-10-CM | POA: Diagnosis not present

## 2024-05-11 DIAGNOSIS — T7840XA Allergy, unspecified, initial encounter: Secondary | ICD-10-CM | POA: Insufficient documentation

## 2024-05-11 NOTE — Patient Instructions (Signed)
 Thank you for trusting us  with your health care.  Avoid all shellfish until you see the allergist.  You should have Benadryl and Pepcid on hand in case of any additional allergic reactions.  If you ever have an allergic reaction that involves lip, tongue or throat swelling or even an itchy throat, you should go to the emergency department or call 911

## 2024-05-11 NOTE — Progress Notes (Signed)
 New Patient Office Visit  Subjective    Patient ID: Tara [name], female    DOB: [birth year]  Age: 23 y.o. MRN: [id number]  CC:  Chief Complaint  Patient presents with   Establish Care    Recently went to [place] [facility] [place] [facility] and had hives from hip to chest. Wasn't sure if it was from seafood so wants to see what she is allergic to.    HPI Tara [name] presents to establish care Previous PCP: pediatrician in [place]   Other providers: OB/GYN   States she developed a rash while in [place] in early March. The rash was not painful and no itching.  Currently she does not have a rash.  States rash started on her right hip and spread to her chest, arms and both legs. States the rash resolved with oral steroids she was prescribed   She had a lot of seafood for the 3 days prior to the rash. Denies any hx of food allergies.  Also reports having allergies to pollen and she takes Benadryl prn.   Denies having any oral edema, throat itching, coughing, chest tightness or wheezing.  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  Plans to be an attorney    Outpatient Encounter Medications as of 05/11/2024  Medication Sig   desogestrel -ethinyl estradiol  (MIRCETTE ) 0.15-0.02/0.01 MG (21/5) tablet Take 1 tablet by mouth at bedtime.   [DISCONTINUED] doxycycline  (MONODOX ) 100 MG capsule Take one cap po QHS with food.   [DISCONTINUED] hydrOXYzine  (ATARAX ) 25 MG tablet Take 1 tablet (25 mg total) by mouth every 6 (six) hours.   [DISCONTINUED] predniSONE  (DELTASONE ) 10 MG tablet Take 2 tablets (20 mg total) by mouth 2 (two) times daily.   [DISCONTINUED] spironolactone  (ALDACTONE ) 100 MG tablet Take 1 tablet (100 mg total) by mouth daily.   [DISCONTINUED] Tretinoin  Microsphere (RETIN-A  MICRO PUMP) 0.08 % GEL Apply nightly to entire face as tolerated.   No facility-administered encounter medications on file as of 05/11/2024.    History reviewed. No pertinent past medical history.  History  reviewed. No pertinent surgical history.  History reviewed. No pertinent family history.  Social History   Socioeconomic History   Marital status: Single    Spouse name: Not on file   Number of children: Not on file   Years of education: Not on file   Highest education level: GED or equivalent  Occupational History   Not on file  Tobacco Use   Smoking status: Never   Smokeless tobacco: Not on file  Substance and Sexual Activity   Alcohol use: No   Drug use: No   Sexual activity: Yes    Birth control/protection: Pill  Other Topics Concern   Not on file  Social History Narrative   Not on file   Social Drivers of Health   Financial Resource Strain: Low Risk  (05/11/2024)   Overall Financial Resource Strain (CARDIA)    Difficulty of Paying Living Expenses: Not hard at all  Food Insecurity: No Food Insecurity (05/11/2024)   Hunger Vital Sign    Worried About Running Out of Food in the Last Year: Never true    Ran Out of Food in the Last Year: Never true  Transportation Needs: No Transportation Needs (05/11/2024)   PRAPARE - Administrator, Civil Service (Medical): No    Lack of Transportation (Non-Medical): No  Physical Activity: Sufficiently Active (05/11/2024)   Exercise Vital Sign    Days of Exercise per Week: 6 days    Minutes  of Exercise per Session: 60 min  Stress: No Stress Concern Present (05/11/2024)   [employer] of Occupational Health - Occupational Stress Questionnaire    Feeling of Stress : Not at all  Social Connections: Unknown (05/11/2024)   Social Connection and Isolation Panel [NHANES]    Frequency of Communication with Friends and Family: More than three times a week    Frequency of Social Gatherings with Friends and Family: Three times a week    Attends Religious Services: 1 to 4 times per year    Active Member of Clubs or Organizations: Yes    Attends Banker Meetings: 1 to 4 times per year    Marital Status: Patient  declined  Intimate Partner Violence: Not on file    Review of Systems  Constitutional:  Negative for chills, fever and malaise/fatigue.  HENT:  Negative for congestion and sore throat.   Respiratory:  Negative for cough, shortness of breath and wheezing.   Cardiovascular:  Negative for chest pain, palpitations and leg swelling.  Gastrointestinal:  Negative for abdominal pain, constipation, diarrhea, nausea and vomiting.  Genitourinary:  Negative for dysuria, frequency and urgency.  Skin:  Negative for itching and rash.  Neurological:  Negative for dizziness, tingling, focal weakness and headaches.        Objective    BP 112/68 (BP Location: Left Arm, Patient Position: Sitting)   Pulse (!) 52   Temp 97.6 F (36.4 C) (Temporal)   Ht 5\' 6"  (1.676 m)   Wt 144 lb (65.3 kg)   SpO2 97%   BMI 23.24 kg/m   Physical Exam Constitutional:      General: She is not in acute distress.    Appearance: She is not ill-appearing.  HENT:     Nose: Nose normal.     Mouth/Throat:     Mouth: Mucous membranes are moist.     Pharynx: Oropharynx is clear.  Eyes:     Extraocular Movements: Extraocular movements intact.     Conjunctiva/sclera: Conjunctivae normal.  Cardiovascular:     Rate and Rhythm: Normal rate and regular rhythm.  Pulmonary:     Effort: Pulmonary effort is normal.     Breath sounds: Normal breath sounds.  Musculoskeletal:        General: Normal range of motion.     Cervical back: Normal range of motion and neck supple.  Skin:    General: Skin is warm and dry.     Findings: No rash.  Neurological:     General: No focal deficit present.     Mental Status: She is alert and oriented to person, place, and time.     Motor: No weakness.     Coordination: Coordination normal.     Gait: Gait normal.  Psychiatric:        Mood and Affect: Mood normal.        Behavior: Behavior normal.        Thought Content: Thought content normal.        Assessment & Plan:   Problem  List Items Addressed This Visit     Allergic reaction - Primary   Relevant Orders   Ambulatory referral to Allergy   Rash and nonspecific skin eruption   Relevant Orders   Ambulatory referral to Allergy   She is a pleasant 23 year old female who is new to practice here to establish care. Previous medical care with pediatrician.  Denies any significant past medical history. She sees OB/GYN. Recent rash related to  possible shellfish intake.  Denies history of food allergies. Currently asymptomatic. She will avoid all shellfish for now Referral to allergy for further evaluation   Return if symptoms worsen or fail to improve.   Alyson Back, NP-C

## 2024-05-17 ENCOUNTER — Ambulatory Visit: Admitting: Family Medicine

## 2024-05-23 ENCOUNTER — Ambulatory Visit (INDEPENDENT_AMBULATORY_CARE_PROVIDER_SITE_OTHER): Admitting: Allergy & Immunology

## 2024-05-23 ENCOUNTER — Other Ambulatory Visit: Payer: Self-pay

## 2024-05-23 ENCOUNTER — Encounter: Payer: Self-pay | Admitting: Allergy & Immunology

## 2024-05-23 VITALS — BP 120/74 | HR 53 | Temp 98.2°F | Resp 18 | Ht 66.54 in | Wt 142.6 lb

## 2024-05-23 DIAGNOSIS — T7803XA Anaphylactic reaction due to other fish, initial encounter: Secondary | ICD-10-CM

## 2024-05-23 DIAGNOSIS — L500 Allergic urticaria: Secondary | ICD-10-CM

## 2024-05-23 DIAGNOSIS — J310 Chronic rhinitis: Secondary | ICD-10-CM | POA: Diagnosis not present

## 2024-05-23 DIAGNOSIS — T7803XD Anaphylactic reaction due to other fish, subsequent encounter: Secondary | ICD-10-CM

## 2024-05-23 NOTE — Patient Instructions (Addendum)
 1. Seafood allergy - We will get some blood work to look for a seafood allergy. - We will hold off on an EpiPen for now until we get the labs back. - We are also going to get an alpha gal panel (since this is a food allergy that can take several hours to present after eating, whereas typically a food allergy presents within 30-60 minutes of eating something).   2. Chronic rhinitis - Because of insurance stipulations, we cannot do skin testing on the same day as your first visit. - We are all working to fight this, but for now we need to do two separate visits.  - We will know more after we do testing at the next visit.  - The skin testing visit can be squeezed in at your convenience.  - Then we can make a more full plan to address all of your symptoms. - Be sure to stop your antihistamines for 3 days before this appointment.  - Consider adding on cetirizine 10mg  daily for better control.  - This lasts longer than Benadryl.   3. Return in about 1 week (around 05/30/2024) for ALLERGY TESTING (1-68) . You can have the follow up appointment with Dr. Idolina Maker or a Nurse Practicioner (our Nurse Practitioners are excellent and always have Physician oversight!).    Please inform us  of any Emergency Department visits, hospitalizations, or changes in symptoms. Call us  before going to the ED for breathing or allergy symptoms since we might be able to fit you in for a sick visit. Feel free to contact us  anytime with any questions, problems, or concerns.  It was a pleasure to meet you today!  Websites that have reliable patient information: 1. American Academy of Asthma, Allergy, and Immunology: www.aaaai.org 2. Food Allergy Research and Education (FARE): foodallergy.org 3. Mothers of Asthmatics: http://www.asthmacommunitynetwork.org 4. American College of Allergy, Asthma, and Immunology: www.acaai.org      "Like" us  on Facebook and Instagram for our latest updates!      A healthy democracy  works best when [employer] participate! Make sure you are registered to vote! If you have moved or changed any of your contact information, you will need to get this updated before voting! Scan the QR codes below to learn more!

## 2024-05-23 NOTE — Progress Notes (Signed)
 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 Seafood allergy - We will get some blood work to look for a seafood allergy. - We will hold off on an EpiPen for now until we get the labs back. - We are also going to get an alpha gal panel (since this is a food allergy that can take several hours to present after eating, whereas typically a food allergy presents within 30-60 minutes of eating something).   2. Chronic rhinitis - Because of insurance stipulations, we cannot do skin testing on the same day as your first visit. - We are all working to fight this, but for now we need to do two separate visits.  - We will know more after we do testing at the next visit.  - The skin testing visit can be squeezed in at your convenience.  - Then we can make a more full plan to address all of your symptoms. - Be sure to stop your antihistamines for 3 days before this appointment.  - Consider adding on cetirizine 10mg  daily for better control.  - This lasts longer than Benadryl.   3. Return in about 1 week (around 05/30/2024) for ALLERGY TESTING (1-68) . You can have the follow up appointment with Dr. Idolina [name] or a Nurse Practicioner (our Nurse Practitioners are excellent and always have Physician oversight!).     This note in its entirety was forwarded to the Provider who requested this consultation.  Subjective:   Tara [name] is a 23 y.o. female presenting today for evaluation of  Chief Complaint  Patient presents with   Allergic Reaction    Pt wants to be tested for shellfish allergy    Tara [name] has a history of the following: Patient Active Problem List    Diagnosis Date Noted   Allergic reaction 05/11/2024   Rash and nonspecific skin eruption 05/11/2024    History obtained from: chart review and patient.  Discussed the use of AI scribe software for clinical note transcription with the patient and/or guardian, who gave verbal consent to proceed.  Tara [name] was referred by Abram Abraham, NP-C.     Tara [name] is a 23 y.o. female presenting for an evaluation of concern for a seafood allergy.  Allergic Rhinitis Symptom History: She has a history of environmental allergies, experiencing symptoms such as sneezing and itchy eyes, particularly in the spring. She manages these symptoms with Benadryl as needed. She denies any history of asthma, frequent sinus infections, or ear infections. She has never been hospitalized and does not have an EpiPen.  Food Allergy Symptom History: She developed a rash after consuming seafood for three consecutive days while visiting [place] in March. The rash appeared the morning after the last seafood meal, starting from her hip and spreading to her legs. It lasted for about three days and resolved without treatment. No throat swelling, stomach pain, diarrhea, or wheezing occurred during this episode.  She had previously tolerated seafood without a problem.  She denies any issues with concurrent viral illnesses or fevers.  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  She  is thinking about doing immigration law, although she originally wanted to do family law.  She was born and raised in the area and is the youngest sibling on her mother's side.   Otherwise, there is no history of other atopic diseases, including drug allergies, stinging insect allergies, or contact dermatitis. There is no significant infectious history. Vaccinations are up to date.    Past Medical History: Patient Active Problem List    Diagnosis Date Noted   Allergic reaction 05/11/2024   Rash and nonspecific skin eruption 05/11/2024    Medication List:  Allergies as of 05/23/2024   No Known Allergies      Medication List        Accurate as of May 23, 2024  9:54 AM. If you have any questions, ask your nurse or doctor.          desogestrel -ethinyl estradiol  0.15-0.02/0.01 MG (21/5) tablet Commonly known as: Mircette  Take 1 tablet by mouth at bedtime.        Birth History: non-contributory  Developmental History: non-contributory  Past Surgical History: History reviewed. No pertinent surgical history.   Family History: Family History  Problem Relation Age of Onset   Asthma Brother      Social History: Tara [name] lives at home with her family.  She lives in a house that is 16+ years old.  There is wood throughout the home. There is electric heating and central cooling.  There is a dog inside of the home and birds outside of the home.  There are dust mite covers on the bed and the pillows.  There is no tobacco exposure.  She currently works as a Scientist, physiological for the past 4 months.  There is no fume, chemical, or dust exposure.  There is a HEPA filter in the home.  She does not live near an interstate or industrial area.   Review of systems otherwise negative other than that mentioned in the HPI.    Objective:   Blood pressure 120/74, pulse (!) 53, temperature 98.2 F (36.8 C), temperature source Temporal, resp. rate 18, height 5' 6.54" (1.69 m), weight 142 lb 9.6 oz (64.7 kg), SpO2 100%. Body mass index is 22.65 kg/m.     Physical Exam Vitals reviewed.  Constitutional:      Appearance: She is well-developed.  HENT:     Head: Normocephalic and atraumatic.     Right Ear: Tympanic membrane, ear canal and external ear normal. No drainage, swelling or tenderness. Tympanic membrane is not injected, scarred, erythematous, retracted or bulging.     Left Ear: Tympanic membrane, ear canal and  external ear normal. No drainage, swelling or tenderness. Tympanic membrane is not injected, scarred, erythematous, retracted or bulging.     Nose: No nasal deformity, septal deviation, mucosal edema or rhinorrhea.     Right Turbinates: Enlarged, swollen and pale.     Left Turbinates: Enlarged, swollen and pale.     Right Sinus: No maxillary sinus tenderness or frontal sinus tenderness.     Left Sinus: No maxillary sinus tenderness or frontal sinus tenderness.     Mouth/Throat:     Mouth: Mucous membranes are not pale and not dry.     Pharynx: Uvula midline.  Eyes:     General:        Right eye: No discharge.        Left eye: No discharge.     Conjunctiva/sclera: Conjunctivae normal.     Right eye: Right conjunctiva is not injected. No chemosis.  Left eye: Left conjunctiva is not injected. No chemosis.    Pupils: Pupils are equal, round, and reactive to light.  Cardiovascular:     Rate and Rhythm: Normal rate and regular rhythm.     Heart sounds: Normal heart sounds.  Pulmonary:     Effort: Pulmonary effort is normal. No tachypnea, accessory muscle usage or respiratory distress.     Breath sounds: Normal breath sounds. No wheezing, rhonchi or rales.  Chest:     Chest wall: No tenderness.  Abdominal:     Tenderness: There is no abdominal tenderness. There is no guarding or rebound.  Lymphadenopathy:     Head:     Right side of head: No submandibular, tonsillar or occipital adenopathy.     Left side of head: No submandibular, tonsillar or occipital adenopathy.     Cervical: No cervical adenopathy.  Skin:    Coloration: Skin is not pale.     Findings: No abrasion, erythema, petechiae or rash. Rash is not papular, urticarial or vesicular.  Neurological:     Mental Status: She is alert.  Psychiatric:        Behavior: Behavior is cooperative.      Diagnostic studies: none        Drexel Gentles, MD [facility] 

## 2024-05-26 LAB — ALLERGY PANEL 19, SEAFOOD GROUP

## 2024-05-26 LAB — ALPHA-GAL PANEL
Allergen Lamb IgE: <0.1 kU/L
Beef IgE: <0.1 kU/L
IgE (Immunoglobulin E), Serum: 701 [IU]/mL — ABNORMAL HIGH (ref 6–495)
O215-IgE Alpha-Gal: <0.1 kU/L
Pork IgE: <0.1 kU/L

## 2024-05-26 LAB — TRYPTASE: Tryptase: 4.4 ug/L (ref 2.2–13.2)

## 2024-05-30 ENCOUNTER — Ambulatory Visit: Payer: Self-pay | Admitting: Allergy & Immunology

## 2024-06-06 ENCOUNTER — Ambulatory Visit (INDEPENDENT_AMBULATORY_CARE_PROVIDER_SITE_OTHER): Admitting: Allergy & Immunology

## 2024-06-06 ENCOUNTER — Encounter: Payer: Self-pay | Admitting: Allergy & Immunology

## 2024-06-06 ENCOUNTER — Telehealth: Payer: Self-pay | Admitting: Family

## 2024-06-06 DIAGNOSIS — J302 Other seasonal allergic rhinitis: Secondary | ICD-10-CM | POA: Diagnosis not present

## 2024-06-06 DIAGNOSIS — L500 Allergic urticaria: Secondary | ICD-10-CM

## 2024-06-06 DIAGNOSIS — J3089 Other allergic rhinitis: Secondary | ICD-10-CM

## 2024-06-06 NOTE — Progress Notes (Signed)
 FOLLOW UP  Date of Service/Encounter:  06/06/24   Assessment:   Seasonal and perennial allergic rhinitis (grasses, trees, and dust mites  Allergic urticaria - with negative skin and blood work for seafood  Plan/Recommendations:   1. Seafood allergy  - Skin testing AND blood work was negative. - Let's get you scheduled for a shrimp challenge to take this off of your allergy  list. - This will take around 2 hours total.   2. Chronic rhinitis - Testing today showed: grasses, trees, and dust mites - Copy of test results provided.  - Avoidance measures provided. - We did not do more sensitive intradermal testing, but we can do that in the future if needed (this involves needles in the arms and I did not think that this would add much to the management). - Continue with: Zyrtec (cetirizine) 10mg  tablet once daily - You can use an extra dose of the antihistamine, if needed, for breakthrough symptoms.  - Consider nasal saline rinses 1-2 times daily to remove allergens from the nasal cavities as well as help with mucous clearance (this is especially helpful to do before the nasal sprays are given) - Consider allergy  shots as a means of long-term control. - Allergy  shots re-train and reset the immune system to ignore environmental allergens and decrease the resulting immune response to those allergens (sneezing, itchy watery eyes, runny nose, nasal congestion, etc).    - Allergy  shots improve symptoms in 75-85% of patients.  - We can discuss more at the next appointment if the medications are not working for you.  3. Return in about 4 weeks (around 07/04/2024) for [facility]. You can have the follow up appointment with Dr. Idolina Maker or a Nurse Practicioner (our Nurse Practitioners are excellent and always have Physician oversight!).    Subjective:   Tara [name] is a 23 y.o. female presenting today for follow up of No chief complaint on file.   Tara [name]  has a history of the following: Patient Active Problem List   Diagnosis Date Noted   Allergic reaction 05/11/2024   Rash and nonspecific skin eruption 05/11/2024    History obtained from: chart review and patient.  Discussed the use of AI scribe software for clinical note transcription with the patient and/or guardian, who gave verbal consent to proceed.  Tara [name] is a 23 y.o. female presenting for skin testing. She was last seen on June 3. We could not do testing because her insurance company does not cover testing on the same day as a New Patient visit. She has been off of all antihistamines 3 days in anticipation of the testing.   At that visit, we obtained blood work to look for seafood allergy .  This came back negative.  We plan to follow-up with skin testing today.  For his rhinitis, we decided to do allergy  testing to try to figure out a trigger of his symptoms.  We discussed adding on cetirizine 10 mg daily.  She had previously been on Benadryl.  She has experienced allergic reactions to seafood, specifically shrimp, crabs, and oysters, which began in April of this year. The reactions manifest as a rash approximately four hours after consumption. Despite these reactions, recent blood work and skin testing for seafood allergies have returned negative results.  She also suffers from environmental allergies, with symptoms worsening in April. She manages her symptoms with Benadryl as needed and has not tried other medications.  Otherwise, there have been no changes to her past medical history, surgical history,  family history, or social history.    Review of systems otherwise negative other than that mentioned in the HPI.    Objective:   There were no vitals taken for this visit. There is no height or weight on file to calculate BMI.    Physical exam deferred since this was a skin testing appointment only.   Diagnostic studies:   Allergy  Studies:     Airborne Adult Perc -  06/06/24 0833     Time Antigen Placed 1610    Allergen Manufacturer Floyd Hutchinson    Location Back    Number of Test 55    Panel 1 Select    1. Control-Buffer 50% Glycerol Negative    2. Control-Histamine 2+    3. Bahia Negative    4. French Southern Territories Negative    5. Johnson Negative    6. [place]  Blue 3+    7. Meadow Fescue 3+    8. Perennial Rye 3+    9. Timothy 3+    10. Ragweed Mix Negative    11. Cocklebur Negative    12. Plantain,  English Negative    13. Baccharis Negative    14. Dog Fennel Negative    15. Russian Thistle Negative    16. Lamb's Quarters Negative    17. Sheep Sorrell Negative    18. Rough Pigweed Negative    19. Marsh Elder, Rough Negative    20. Mugwort, Common Negative    21. Box, Elder 3+    22. Cedar, red Negative    23. Sweet Gum 3+    24. Pecan Pollen Negative    25. Pine Mix Negative    26. Walnut, Black Pollen 3+    27. Red Mulberry Negative    28. Ash Mix Negative    29. Birch Mix Negative    30. Beech American Negative    31. Cottonwood, Eastern 3+    32. Hickory, White 3+    33. Maple Mix Negative    34. Oak, Guinea-Bissau Mix Negative    35. Sycamore Eastern Negative    36. Alternaria Alternata Negative    37. Cladosporium Herbarum Negative    38. Aspergillus Mix Negative    39. Penicillium Mix Negative    40. Bipolaris Sorokiniana (Helminthosporium) Negative    41. Drechslera Spicifera (Curvularia) Negative    42. Mucor Plumbeus Negative    43. Fusarium Moniliforme Negative    44. Aureobasidium Pullulans (pullulara) Negative    45. Rhizopus Oryzae Negative    46. Botrytis Cinera Negative    47. Epicoccum Nigrum Negative    48. Phoma Betae Negative    49. Dust Mite Mix 4+    50. Cat Hair 10,000 BAU/ml Negative    51.  Dog Epithelia Negative    52. Mixed Feathers Negative    53. Horse Epithelia Negative    54. Cockroach, German Negative    55. Tobacco Leaf Negative          13 Food Perc - 06/06/24 0833       Test Information   Time  Antigen Placed --    Allergen Manufacturer --    Location --    Number of allergen test --    Food --      Food   1. Peanut Omitted    2. Soybean Omitted    3. Wheat Omitted    4. Sesame Omitted    5. Milk, Cow Omitted    6. Casein Omitted    7.  Egg White, Chicken Omitted    8. Shellfish Mix Omitted    9. Fish Mix Omitted    10. Cashew Omitted    11. [employer] Omitted    12. Almond Omitted    13. Hazelnut Omitted          Food Adult Perc - 06/06/24 0900     Time Antigen Placed 0900    Allergen Manufacturer Floyd Hutchinson    Location Back    Number of allergen test 27    1. Peanut Negative    2. Soybean Negative    3. Wheat Negative    4. Sesame Negative    5. Milk, Cow Negative    6. Casein Negative    7. Egg White, Chicken Negative    8. Shellfish Mix Negative    9. Fish Mix Negative    10. Cashew Negative    11. Walnut Food Negative    12. Almond Negative    13. Hazelnut Negative    14. Pecan Food Negative    15. Pistachio Negative    16. [place] Nut Negative    17. Coconut Negative    18. Trout Negative    19. Tuna Negative    20. Salmon Negative    21. Flounder Negative    22. Codfish Negative    23. Shrimp Negative    24. Crab Negative    25. Lobster Negative    26. Oyster Negative    27. Scallops Negative          Allergy  testing results were read and interpreted by myself, documented by clinical staff.      Drexel Gentles, MD  [facility] 

## 2024-06-06 NOTE — Telephone Encounter (Signed)
 Patient has a shrimp challenge scheduled with Chrissie on 07/15 at 8:30am.  Please mail out Challenge protocol to patient.  Best Contact: [phone]

## 2024-06-06 NOTE — Patient Instructions (Addendum)
 1. Seafood allergy  - Skin testing AND blood work was negative. - Let's get you scheduled for a shrimp challenge to take this off of your allergy  list. - This will take around 2 hours total.   2. Chronic rhinitis - Testing today showed: grasses, trees, and dust mites - Copy of test results provided.  - Avoidance measures provided. - We did not do more sensitive intradermal testing, but we can do that in the future if needed (this involves needles in the arms and I did not think that this would add much to the management). - Continue with: Zyrtec (cetirizine) 10mg  tablet once daily - You can use an extra dose of the antihistamine, if needed, for breakthrough symptoms.  - Consider nasal saline rinses 1-2 times daily to remove allergens from the nasal cavities as well as help with mucous clearance (this is especially helpful to do before the nasal sprays are given) - Consider allergy  shots as a means of long-term control. - Allergy  shots re-train and reset the immune system to ignore environmental allergens and decrease the resulting immune response to those allergens (sneezing, itchy watery eyes, runny nose, nasal congestion, etc).    - Allergy  shots improve symptoms in 75-85% of patients.  - We can discuss more at the next appointment if the medications are not working for you.  3. Return in about 4 weeks (around 07/04/2024) for [facility]. You can have the follow up appointment with Dr. Idolina Maker or a Nurse Practicioner (our Nurse Practitioners are excellent and always have Physician oversight!).    Please inform us  of any Emergency Department visits, hospitalizations, or changes in symptoms. Call us  before going to the ED for breathing or allergy  symptoms since we might be able to fit you in for a sick visit. Feel free to contact us  anytime with any questions, problems, or concerns.  It was a pleasure to meet you today!  Websites that have reliable patient information: 1. American  Academy of Asthma, Allergy , and Immunology: www.aaaai.org 2. Food Allergy  Research and Education (FARE): foodallergy.org 3. Mothers of Asthmatics: http://www.asthmacommunitynetwork.org 4. [employer] of Allergy , Asthma, and Immunology: www.acaai.org      "Like" us  on Facebook and Instagram for our latest updates!      A healthy democracy works best when [employer] participate! Make sure you are registered to vote! If you have moved or changed any of your contact information, you will need to get this updated before voting! Scan the QR codes below to learn more!      Airborne Adult Perc - 06/06/24 0833     Time Antigen Placed 0981    Allergen Manufacturer Floyd Hutchinson    Location Back    Number of Test 55    Panel 1 Select    1. Control-Buffer 50% Glycerol Negative    2. Control-Histamine 2+    3. Bahia Negative    4. French Southern Territories Negative    5. Johnson Negative    6. [place]  Blue 3+    7. Meadow Fescue 3+    8. Perennial Rye 3+    9. Timothy 3+    10. Ragweed Mix Negative    11. Cocklebur Negative    12. Plantain,  English Negative    13. Baccharis Negative    14. Dog Fennel Negative    15. Russian Thistle Negative    16. Lamb's Quarters Negative    17. Sheep Sorrell Negative    18. Rough Pigweed Negative    19. Wenda Hallmark,  Rough Negative    20. Mugwort, Common Negative    21. Box, Elder 3+    22. Cedar, red Negative    23. Sweet Gum 3+    24. Pecan Pollen Negative    25. Pine Mix Negative    26. Walnut, Black Pollen 3+    27. Red Mulberry Negative    28. Ash Mix Negative    29. Birch Mix Negative    30. Beech American Negative    31. Cottonwood, Eastern 3+    32. Hickory, White 3+    33. Maple Mix Negative    34. Oak, Guinea-Bissau Mix Negative    35. Sycamore Eastern Negative    36. Alternaria Alternata Negative    37. Cladosporium Herbarum Negative    38. Aspergillus Mix Negative    39. Penicillium Mix Negative    40. Bipolaris Sorokiniana (Helminthosporium)  Negative    41. Drechslera Spicifera (Curvularia) Negative    42. Mucor Plumbeus Negative    43. Fusarium Moniliforme Negative    44. Aureobasidium Pullulans (pullulara) Negative    45. Rhizopus Oryzae Negative    46. Botrytis Cinera Negative    47. Epicoccum Nigrum Negative    48. Phoma Betae Negative    49. Dust Mite Mix 4+    50. Cat Hair 10,000 BAU/ml Negative    51.  Dog Epithelia Negative    52. Mixed Feathers Negative    53. Horse Epithelia Negative    54. Cockroach, German Negative    55. Tobacco Leaf Negative          13 Food Perc - 06/06/24 0833         Food Adult Perc - 06/06/24 0900     Time Antigen Placed 0900    Allergen Manufacturer Floyd Hutchinson    Location Back    Number of allergen test 27    1. Peanut Negative    2. Soybean Negative    3. Wheat Negative    4. Sesame Negative    5. Milk, Cow Negative    6. Casein Negative    7. Egg White, Chicken Negative    8. Shellfish Mix Negative    9. Fish Mix Negative    10. Cashew Negative    11. Walnut Food Negative    12. Almond Negative    13. Hazelnut Negative    14. Pecan Food Negative    15. Pistachio Negative    16. [place] Nut Negative    17. Coconut Negative    18. Trout Negative    19. Tuna Negative    20. Salmon Negative    21. Flounder Negative    22. Codfish Negative    23. Shrimp Negative    24. Crab Negative    25. Lobster Negative    26. Oyster Negative    27. Scallops Negative           Reducing Pollen Exposure  The American Academy of Allergy , Asthma and Immunology suggests the following steps to reduce your exposure to pollen during allergy  seasons.    Do not hang sheets or clothing out to dry; pollen may collect on these items. Do not mow lawns or spend time around freshly cut grass; mowing stirs up pollen. Keep windows closed at night.  Keep car windows closed while driving. Minimize morning activities outdoors, a time when pollen counts are usually at their highest. Stay  indoors as much as possible when pollen counts or humidity is high and on  windy days when pollen tends to remain in the air longer. Use air conditioning when possible.  Many air conditioners have filters that trap the pollen spores. Use a HEPA room air filter to remove pollen form the indoor air you breathe.  Control of Dust Mite Allergen    Dust mites play a major role in allergic asthma and rhinitis.  They occur in environments with high humidity wherever human skin is found.  Dust mites absorb humidity from the atmosphere (ie, they do not drink) and feed on organic matter (including shed human and animal skin).  Dust mites are a microscopic type of insect that you cannot see with the naked eye.  High levels of dust mites have been detected from mattresses, pillows, carpets, upholstered furniture, bed covers, clothes, soft toys and any woven material.  The principal allergen of the dust mite is found in its feces.  A gram of dust may contain 1,000 mites and 250,000 fecal particles.  Mite antigen is easily measured in the air during house cleaning activities.  Dust mites do not bite and do not cause harm to humans, other than by triggering allergies/asthma.    Ways to decrease your exposure to dust mites in your home:  Encase mattresses, box springs and pillows with a mite-impermeable barrier or cover   Wash sheets, blankets and drapes weekly in hot water (130 F) with detergent and dry them in a dryer on the hot setting.  Have the room cleaned frequently with a vacuum cleaner and a damp dust-mop.  For carpeting or rugs, vacuuming with a vacuum cleaner equipped with a high-efficiency particulate air (HEPA) filter.  The dust mite allergic individual should not be in a room which is being cleaned and should wait 1 hour after cleaning before going into the room. Do not sleep on upholstered furniture (eg, couches).   If possible removing carpeting, upholstered furniture and drapery from the home is  ideal.  Horizontal blinds should be eliminated in the rooms where the person spends the most time (bedroom, study, television room).  Washable vinyl, roller-type shades are optimal. Remove all non-washable stuffed toys from the bedroom.  Wash stuffed toys weekly like sheets and blankets above.   Reduce indoor humidity to less than 50%.  Inexpensive humidity monitors can be purchased at most hardware stores.  Do not use a humidifier as can make the problem worse and are not recommended.

## 2024-06-06 NOTE — Telephone Encounter (Signed)
 Shrimp challenge protocol has been placed in the mail.

## 2024-07-03 NOTE — Patient Instructions (Incomplete)
 Mahek was able to tolerate the shrimp food challenge today at the office without adverse signs or symptoms of an allergic reaction. Therefore, she has the same risk of systemic reaction associated with the consumption of shellfish products as the general population.  - Do not give any shellfish products for the next 24 hours. - Monitor for allergic symptoms such as rash, wheezing, diarrhea, swelling, and vomiting for the next 24 hours. If severe symptoms occur, treat with EpiPen injection and call 911. For less severe symptoms treat with Benadryl 4 teaspoonfuls every 4-6 hours and call the clinic.  - If no allergic symptoms are evident, reintroduce shellfish products into the diet, 1-2 servings a day. If she develops an allergic reaction to shellfish products, record what was eaten, the amount eaten, preparation method, time from ingestion to reaction, and symptoms.

## 2024-07-04 ENCOUNTER — Ambulatory Visit (INDEPENDENT_AMBULATORY_CARE_PROVIDER_SITE_OTHER): Admitting: Family

## 2024-07-04 ENCOUNTER — Encounter: Payer: Self-pay | Admitting: Family

## 2024-07-04 ENCOUNTER — Other Ambulatory Visit: Payer: Self-pay

## 2024-07-04 VITALS — BP 110/70 | HR 74 | Temp 98.2°F | Resp 20 | Ht 66.54 in | Wt 143.6 lb

## 2024-07-04 DIAGNOSIS — L500 Allergic urticaria: Secondary | ICD-10-CM | POA: Diagnosis not present

## 2024-07-04 DIAGNOSIS — Z91013 Allergy to seafood: Secondary | ICD-10-CM

## 2024-07-04 NOTE — Progress Notes (Signed)
 [address] [place] [address] Dept: [phone]  FOLLOW UP NOTE  Patient ID: Tara Tara [name], female    DOB: [birth year]  Age: 23 y.o. MRN: [id number] Date of Office Visit: 07/04/2024  Assessment  Chief Complaint: Food/Drug Challenge  HPI Tara Tara [name] is a 23 year old female who presents today for an oral food challenge to shrimp.  She was last seen on June 06, 2024 by Dr. Iva for seafood allergy  and allergic rhinitis.  She denies any new diagnosis or surgery since her last office visit.  She reports that she has had reactions with shrimp, crab, and oyster which began in April of this year.  Her reactions manifest as a rash approximately 4 hours after the consumption.  She denies any other symptoms.  She reports that she has been off all antihistamines for the past 3 days and is in good health.  She denies any cardiorespiratory, gastrointestinal, and cutaneous symptoms.  Opportunity given to ask questions.  Informed consent signed.   Drug Allergies:  No Known Allergies  Review of Systems: Negative except as per HPI  Physical Exam: BP 110/70   Pulse 74   Temp 98.2 F (36.8 C)   Resp 20   Ht 5' 6.54 (1.69 m)   Wt 143 lb 9.6 oz (65.1 kg)   SpO2 99%   BMI 22.80 kg/m    Physical Exam Constitutional:      Appearance: Normal appearance.  HENT:     Head: Normocephalic and atraumatic.     Right Ear: Tympanic membrane, ear canal and external ear normal.     Left Ear: Tympanic membrane, ear canal and external ear normal.     Nose: Nose normal.     Mouth/Throat:     Mouth: Mucous membranes are Tara [name].     Pharynx: Oropharynx is clear.  Eyes:     Conjunctiva/sclera: Conjunctivae normal.  Cardiovascular:     Rate and Rhythm: Regular rhythm.     Heart sounds: Normal heart sounds.  Pulmonary:     Effort: Pulmonary effort is normal.     Breath sounds: Normal breath sounds.     Comments: Lungs clear to auscultation Musculoskeletal:     Cervical  back: Neck supple.  Skin:    General: Skin is warm.     Comments: No rashes or urticarial lesions noted  Neurological:     Mental Status: She is alert and oriented to person, place, and time.  Psychiatric:        Mood and Affect: Mood normal.        Behavior: Behavior normal.        Thought Content: Thought content normal.        Judgment: Judgment normal.     Diagnostics:  Open graded shrimp oral challenge: The patient was able to tolerate the challenge today without adverse signs or symptoms. Vital signs were stable throughout the challenge and observation period. She received multiple doses separated by 15 minutes, each of which was separated by vitals and a brief physical exam. She received the following doses: lip rub, 1/2 shrimp, 1-1/2 shrimp, 2 shrimp, and 4 shrimp.Goal: 8 medium shrimp. She was monitored for 60 minutes following the last dose.   The patient had negative skin prick test and sIgE tests to shrimp and was able to tolerate the open graded oral challenge today without adverse signs or symptoms. Therefore, she has the same risk of systemic reaction associated with the consumption of shellfish as the general population.  Assessment and Plan: 1. Allergic urticaria   2. Seafood allergy      No orders of the defined types were placed in this encounter.   Patient Instructions  Tara Tara [name] was able to tolerate the shrimp food challenge today at the office without adverse signs or symptoms of an allergic reaction. Therefore, she has the same risk of systemic reaction associated with the consumption of shellfish products as the general population.  - Do not give any shellfish products for the next 24 hours. - Monitor for allergic symptoms such as rash, wheezing, diarrhea, swelling, and vomiting for the next 24 hours. If severe symptoms occur,  call 911. For less severe symptoms treat with Benadryl 4 teaspoonfuls every 4-6 hours and call the clinic.  - If no allergic symptoms  are evident, reintroduce shellfish products into the diet, 1-2 servings a day. If she develops an allergic reaction to shellfish products, record what was eaten, the amount eaten, preparation method, time from ingestion to reaction, and symptoms.   Follow up in 6-12 months or sooner if needed   Return in about 6 months (around 01/04/2025), or if symptoms worsen or fail to improve.    Thank you for the opportunity to care for this patient.  Please do not hesitate to contact me with questions.  Wanda Craze, FNP [facility] 

## 2024-10-11 ENCOUNTER — Ambulatory Visit (INDEPENDENT_AMBULATORY_CARE_PROVIDER_SITE_OTHER)

## 2024-10-11 DIAGNOSIS — L700 Acne vulgaris: Secondary | ICD-10-CM

## 2024-10-11 MED ORDER — DOXYCYCLINE MONOHYDRATE 100 MG PO TABS: 100.0000 mg | ORAL_TABLET | Freq: Two times a day (BID) | ORAL | 2 refills | Status: AC

## 2024-10-11 MED ORDER — CLINDAMYCIN PHOS (TWICE-DAILY) 1 % EX GEL: Freq: Two times a day (BID) | CUTANEOUS | 5 refills | Status: AC

## 2024-10-11 MED ORDER — TRETINOIN 0.025 % EX CREA: TOPICAL_CREAM | Freq: Every day | CUTANEOUS | 5 refills | Status: AC

## 2024-10-11 NOTE — Patient Instructions (Addendum)
 Doxycycline  should be taken with food to prevent nausea. Do not lay down for 30 minutes after taking. Be cautious with sun exposure and use good sun protection while on this medication. Pregnant women should not take this medication.   Topical Retinoids:    What are Topical Retinoids? - These are prescription topical medications, used to treat acne, sun damage, fine wrinkles, and several other skin changes on the face. Medications in this family include tretinoin /Retin-A , adapalene/Differin, and Tazorac, among others.   Instructions for Use: 1. These medications should be used at night because some can be inactivated by sunlight. 2. Wash the face with gentle cleanser such as Cetaphil (no salicylic acid or other acne-washes, which can break down the medication), and let it dry completely before applying the topical retinoid.  It is best to wait 10-15 minutes after washing to apply the medicine. 3. Use a small (pea sized) amount of medication with each application. This should cover the whole face.  Apply by dotting small amounts on the skin, and then blend. Avoid applying medication to the areas right around eyes and lips.   4. Topical retinoids can be drying/irritating to the skin, which varies from person to person, and with the strength and type of the medication used. We often recommend starting to use the topical retinoid medication every 2-3 nights, and increase the frequency gradually up to nightly as tolerated over several weeks' time. This irritation typically improves as time goes by, and your skin will respond better if you are able to use the medication more consistently.  5. If you are still experiencing dryness/irritation, you may apply a moisturizer after your toipcal retinoid, such as Cerave PM, Cetaphil, or other bland facial moisturizer. Do not use other night creams or anti-aging creams.  6. These medications take 6 weeks of consistent use to see benefits.    A Note on Cosmetic  Use: - Please note, if you are over the age of 67, insurance will typically not cover these medications. If they are recommended to you for non-medically necessary reasons, you may choose to pay for the medication out of pocket. Prices vary, but a tube of generic tretinoin  can often be purchased for ~$60-100 (this size often lasts several months). This varies considerably, and we recommended that you check www.goodrx.com for prescription discount coupons and to compare prices across pharmacies.   These medications should not be used if you are pregnant, planning to become pregnant soon, or breastfeeding.   Please do not hesitate to contact our office with any questions regarding your medication.   Plan for Acne  In the morning: - Cleanse face with a gentle cleanser OR benzoyl peroxide wash if instructed to use this at your visit(can be purchased over the counter- examples at the bottom) - Wipe face with clindamycin  wipe if prescribed at your visit. This can be used on entire face/chest/back or as a spot treatment to active acne areas  - Apply an oil-free moisturizer  In the evening: - Cleanse face with a regular gentle face wash - Wait for skin to completely dry - Apply a pea sized amount of your retinoid (tretinoin  or adapalene) to your finger. Dot this around your face, and then rub in - If your skin gets dry, you can follow this up with an oil-free moisturizer  If you were instructed to take minocycline or doxycycline . This is the antibiotic we talked about that you will take twice per day for a 3 month course. Take the  medication with food, and don't lie down right after taking it, because it can give you heart burn if you lie down right away.  How to use your Acne creams  Some creams for acne PREVENT acne and some creams TARGET pimples you can see.  Antibiotic creams (erythromycin, benzoyl peroxide, clindamycin  and sulfur sulfacetamide)  How to apply: generally applied once daily  either as a spot treatment or all over the face (see above)  Retinoids (differin/adapalene, retin-A /tretinoin  or tazorac) work by PREVENTING acne.  These medicines are good to control acne, but may cause dryness and increase your risk of sunburn.  Do not use if you are PREGNANT or BREAST FEEDING. It takes 4-6 weeks to have results and the acne may worsen in the beginning.  Some retinoids are stronger than others, but are also more irritating. These are prescription topical medications, used to treat acne, sun damage, fine wrinkles, and several other skin changes on the face. Medications in this family include tretinoin /Retin-A , adapalene/Differin, and Tazorac, among others.   A Note on Cosmetic Use: - Please note, if you are over the age of 77, insurance will typically not cover these medications. If they are recommended to you for non-medically necessary reasons, you may choose to pay for the medication out of pocket. Prices vary, but a tube of generic tretinoin  can often be purchased for ~$60-100 (this size often lasts several months). This varies considerably, and we recommended that you check www.goodrx.com for prescription discount coupons and to compare prices across pharmacies.   How to apply: Use at night time (sunlight makes them inactive). If you wash your face at night, let the skin dry for 20 minutes before applying. Apply to all areas that have breakouts (NOT just to pimples you see). Use a PEA-SIZED amount for the entire face (no more) Dot it on your skin and connect the dots Avoid eyes and lips These may cause irritation in the beginning. To decrease irritation, do the following: 1st month: Use twice a week for the first month  2nd month: Use every other night 3rd month and on: Use every night  Cleansing your skin: -   Do not use harsh "acne" soaps and astringents. - Use water to clean your face.  - If you wear make-up, sunscreen or creams, use non-comedogenic (non-pore  clogging) gentle moisturizing wash or cream. Examples include: Cetaphil, Neutrogena, Clinique  Moisturizers and sunscreen: Apply a "non-comedogenic" (non-pore clogging) lotion with sunscreen in the morning. You may need to re-apply during the day. Neutrogena, Eucerin, Clinique, Vanicream    If you have dryness or irritation, try these tips: Decrease use to every other night or twice weekly as tolerated  Wash the retinoid off after 1 hour and apply a "non-comedogenic" (non-pore-clogging) lotion If dryness or irritation continues, stop using the retinoid.   Benzoyl peroxide washes 3-5% (brand name includes Neutragena Clear Pore, CereVe Acne Foaming Cream Cleanser, Differin Cleanser) that you use in the shower. Can bleach linens (clothes, towels, etc), will NOT bleach your skin or hair). Dry off with a white towel so it doesn't take the color out of clothing and colored towels.         Due to recent changes in healthcare laws, you may see results of your pathology and/or laboratory studies on MyChart before the doctors have had a chance to review them. We understand that in some cases there may be results that are confusing or concerning to you. Please understand that not all results are received  at the same time and often the doctors may need to interpret multiple results in order to provide you with the best plan of care or course of treatment. Therefore, we ask that you please give us  2 business days to thoroughly review all your results before contacting the office for clarification. Should we see a critical lab result, you will be contacted sooner.   If You Need Anything After Your Visit  If you have any questions or concerns for your doctor, please call our main line at [phone] and press option 4 to reach your doctor's medical assistant. If no one answers, please leave a voicemail as directed and we will return your call as soon as possible. Messages left after 4 pm will be  answered the following business day.   You may also send us  a message via MyChart. We typically respond to MyChart messages within 1-2 business days.  For prescription refills, please ask your pharmacy to contact our office. Our fax number is [phone].  If you have an urgent issue when the clinic is closed that cannot wait until the next business day, you can page your doctor at the number below.    Please note that while we do our best to be available for urgent issues outside of office hours, we are not available 24/7.   If you have an urgent issue and are unable to reach us , you may choose to seek medical care at your doctor's office, retail clinic, urgent care center, or emergency room.  If you have a medical emergency, please immediately call 911 or go to the emergency department.  Pager Numbers  - Dr. Hester: [phone]  - Dr. Jackquline: [phone]  - Dr. Claudene: [phone]   - Dr. Raymund: [phone]  In the event of inclement weather, please call our main line at [phone] for an update on the status of any delays or closures.  Dermatology Medication Tips: Please keep the boxes that topical medications come in in order to help keep track of the instructions about where and how to use these. Pharmacies typically print the medication instructions only on the boxes and not directly on the medication tubes.   If your medication is too expensive, please contact our office at [phone] option 4 or send us  a message through MyChart.   We are unable to tell what your co-pay for medications will be in advance as this is different depending on your insurance coverage. However, we may be able to find a substitute medication at lower cost or fill out paperwork to get insurance to cover a needed medication.   If a prior authorization is required to get your medication covered by your insurance company, please allow us  1-2 business days to complete this process.  Drug  prices often vary depending on where the prescription is filled and some pharmacies may offer cheaper prices.  The website www.goodrx.com contains coupons for medications through different pharmacies. The prices here do not account for what the cost may be with help from insurance (it may be cheaper with your insurance), but the website can give you the price if you did not use any insurance.  - You can print the associated coupon and take it with your prescription to the pharmacy.  - You may also stop by our office during regular business hours and pick up a GoodRx coupon card.  - If you need your prescription sent electronically to a different pharmacy, notify our office through [facility] or by  phone at [phone] option 4.     Si Usted Necesita Algo Despus de Su Visita  Tambin puede enviarnos un mensaje a travs de Clinical cytogeneticist. Por lo general respondemos a los mensajes de MyChart en el transcurso de 1 a 2 das hbiles.  Para renovar recetas, por favor pida a su farmacia que se ponga en contacto con nuestra oficina. Randi lakes de fax es [place] [phone].  Si tiene un asunto urgente cuando la clnica est cerrada y que no puede esperar hasta el siguiente da hbil, puede llamar/localizar a su doctor(a) al nmero que aparece a continuacin.   Por favor, tenga en cuenta que aunque hacemos todo lo posible para estar disponibles para asuntos urgentes fuera del horario de [place], no estamos disponibles las 24 horas del da, los 7 [address]  [address] de la [place].   Si tiene un problema urgente y no puede comunicarse con nosotros, puede optar por buscar atencin mdica  en el consultorio de su doctor(a), en una clnica privada, en un centro de atencin urgente o en una sala de emergencias.  Si tiene Engineer, drilling, por favor llame inmediatamente al 911 o vaya a la sala de emergencias.  Nmeros de bper  - Dr. Hester: [phone]  - Dra. Jackquline: [phone]  - Dr. Claudene:  [phone]  - Dra. Kitts: [phone]  En caso de inclemencias del [place], por favor llame a nuestra lnea principal al [phone] para una actualizacin sobre el estado de cualquier retraso o cierre.  Consejos para la medicacin en dermatologa: Por favor, guarde las cajas en las que vienen los medicamentos de uso tpico para ayudarle a seguir las instrucciones sobre dnde y cmo usarlos. Las farmacias generalmente imprimen las instrucciones del medicamento slo en las cajas y no directamente en los tubos del [place].   Si su medicamento es muy caro, por favor, pngase en contacto con landry rieger llamando al [phone] y presione la opcin 4 o envenos un mensaje a travs de Clinical cytogeneticist.   No podemos decirle cul ser su copago por los medicamentos por adelantado ya que esto es diferente dependiendo de la cobertura de su seguro. Sin embargo, es posible que podamos encontrar un medicamento sustituto a Audiological scientist un formulario para que el seguro cubra el medicamento que se considera necesario.   Si se requiere una autorizacin previa para que su compaa de seguros malta su medicamento, por favor permtanos de 1 a 2 das hbiles para completar este proceso.  Los precios de los medicamentos varan con frecuencia dependiendo del Environmental consultant de dnde se surte la receta y alguna farmacias pueden ofrecer precios ms baratos.  El sitio web www.goodrx.com tiene cupones para medicamentos de Health and safety inspector. Los precios aqu no tienen en cuenta lo que podra costar con la ayuda del seguro (puede ser ms barato con su seguro), pero el sitio web puede darle el precio si no utiliz Tourist information centre manager.  - Puede imprimir el cupn correspondiente y llevarlo con su receta a la farmacia.  - Tambin puede pasar por nuestra oficina durante el horario de atencin regular y Education officer, museum una tarjeta de cupones de GoodRx.  - Si necesita que su receta se enve electrnicamente a una farmacia diferente, informe  a nuestra oficina a travs de MyChart de [place] o por telfono llamando al [phone] y presione la opcin 4.

## 2024-10-11 NOTE — Progress Notes (Signed)
    Subjective   Tara [name] is a 23 y.o. female who presents for the following: Acne. Patient is established patient   Today patient reports: Acne; has been treated in the past but is unsure what the treatment was. Patient denied back or chest acne only face. She is currently using Cereva Cleanser and daily moisturizer.   Review of Systems:    No other skin or systemic complaints except as noted in HPI or Assessment and Plan.  The following portions of the chart were reviewed this encounter and updated as appropriate: medications, allergies, medical history  Relevant Medical History:  n/a   Objective  Well appearing patient in no apparent distress; mood and affect are within normal limits. Examination was performed of the: Focused Exam of: Face   Examination notable for: - Scattered open and closed comedones on the face - Red, inflammatory papules and pustules on face  - some PIH and scarring   Examination limited by: Clothing and Patient deferred removal       Assessment & Plan   Acne vulgaris - moderate, comedonal, and inflammatory w/ mild scarring and PIH  - Chronic and persistent condition with duration or expected duration over one year. Condition is symptomatic and bothersome to patient. Patient is flaring and not currently at treatment goal.  - Prior tx: spironolactone  (dizziness), doxy, clinda/bp  - Discussed various treatment options with patient, as well as need for consistent use for at least 6-12 weeks for full efficacy.  - Reviewed treatment options, including side effects of topical agents, oral antibiotics, OCPs (if female), oral spironolactone  (if female), and isotretinoin. Discussed that isotretinoin is the most effective  - After discussion opted to initiate: start tretinoin  0.025% cream in the evening. Educated patient about proper use and potential side effects, including dryness, irritation, sun sensitivity, and transient worsening of acne. - Start  topical clindamycin  gel 1% daily to active areas  - Start doxycycline  100mg  BID x3 months  - Discussed side effects and precautions with doxycycline  including taking with meal, waiting at least 30 minutes before lying down at night, increased sun sensitivity, and to stop medication if becomes pregnant or breastfeeding - discussed isotretinoin if not improved      Chronic and persistent condition with duration or expected duration over one year. Condition is symptomatic and bothersome to patient. Patient is flaring and not currently at treatment goal.   Procedures, orders, diagnosis for this visit:    There are no diagnoses linked to this encounter.  Return to clinic: Return in about 3 months (around 01/11/2025) for Acne.  I, Emerick Ege, CMA am acting as scribe for Lauraine JAYSON Kanaris, MD   Documentation: I have reviewed the above documentation for accuracy and completeness, and I agree with the above.  Lauraine JAYSON Kanaris, MD

## 2024-12-19 ENCOUNTER — Other Ambulatory Visit: Payer: Self-pay | Admitting: Certified Nurse Midwife

## 2024-12-19 DIAGNOSIS — N921 Excessive and frequent menstruation with irregular cycle: Secondary | ICD-10-CM

## 2024-12-19 DIAGNOSIS — N946 Dysmenorrhea, unspecified: Secondary | ICD-10-CM

## 2024-12-19 MED ORDER — DESOGESTREL-ETHINYL ESTRADIOL 0.15-0.02/0.01 MG (21/5) PO TABS: 1.0000 | ORAL_TABLET | Freq: Every day | ORAL | 0 refills | Status: DC

## 2025-01-05 NOTE — Progress Notes (Unsigned)
" ° °  ANNUAL EXAM Patient name: Tara [name] MRN [id number]  Date of birth: 02/01/2001 Chief Complaint:   No chief complaint on file.  History of Present Illness:   Tara [name] is a 24 y.o. G0P0000 {race:25618} female being seen today for a routine annual exam.  Current complaints: ***  No LMP recorded.       The pregnancy intention screening data noted above was reviewed. Potential methods of contraception were discussed. The patient elected to proceed with No data recorded.   No results found for: DIAGPAP, HPVHIGH, ADEQPAP    Last pap N/A. Results were: N/A. H/O abnormal pap: no Last mammogram: N/A. Results were: N/A. Family h/o breast cancer: {yes***/no:23838} Last colonoscopy: N/A. Results were: N/A. Family h/o colorectal cancer: {yes***/no:23838}     05/11/2024    9:29 AM  Depression screen PHQ 2/9  Decreased Interest 0  Down, Depressed, Hopeless 0  PHQ - 2 Score 0         No data to display            No past medical history on file.  Family History  Problem Relation Age of Onset   Asthma Brother    Review of Systems:   Pertinent items are noted in HPI Denies any headaches, blurred vision, fatigue, shortness of breath, chest pain, abdominal pain, abnormal vaginal discharge/itching/odor/irritation, problems with periods, bowel movements, urination, or intercourse unless otherwise stated above. Pertinent History Reviewed:  Reviewed past medical,surgical, social and family history.  Reviewed problem list, medications and allergies. Physical Assessment:  There were no vitals filed for this visit.There is no height or weight on file to calculate BMI.       Physical Exam   No results found for this or any previous visit (from the past 24 hours).  Assessment & Plan:  1. Well woman exam with routine gynecological exam (Primary)   Mammogram: {Mammo f/u:25212::@ 24yo}, or sooner if problems Colonoscopy: {TCS f/u:25213::@ 24yo},  or sooner if problems  No orders of the defined types were placed in this encounter.   Meds: No orders of the defined types were placed in this encounter.   Follow-up: No follow-ups on file.  Rollo JINNY Maxin, CMA 01/05/2025 1:18 PM  "

## 2025-01-05 NOTE — Patient Instructions (Incomplete)

## 2025-01-09 ENCOUNTER — Encounter: Payer: Self-pay | Admitting: Certified Nurse Midwife

## 2025-01-09 ENCOUNTER — Other Ambulatory Visit (HOSPITAL_COMMUNITY)
Admission: RE | Admit: 2025-01-09 | Discharge: 2025-01-09 | Disposition: A | Source: Ambulatory Visit | Attending: Certified Nurse Midwife | Admitting: Certified Nurse Midwife

## 2025-01-09 ENCOUNTER — Ambulatory Visit: Admitting: Certified Nurse Midwife

## 2025-01-09 VITALS — BP 113/77 | HR 66 | Ht 66.0 in | Wt 141.8 lb

## 2025-01-09 DIAGNOSIS — Z01419 Encounter for gynecological examination (general) (routine) without abnormal findings: Secondary | ICD-10-CM

## 2025-01-09 DIAGNOSIS — Z1151 Encounter for screening for human papillomavirus (HPV): Secondary | ICD-10-CM | POA: Diagnosis present

## 2025-01-09 DIAGNOSIS — Z113 Encounter for screening for infections with a predominantly sexual mode of transmission: Secondary | ICD-10-CM

## 2025-01-09 DIAGNOSIS — N946 Dysmenorrhea, unspecified: Secondary | ICD-10-CM

## 2025-01-09 DIAGNOSIS — Z124 Encounter for screening for malignant neoplasm of cervix: Secondary | ICD-10-CM

## 2025-01-09 DIAGNOSIS — Z01411 Encounter for gynecological examination (general) (routine) with abnormal findings: Secondary | ICD-10-CM | POA: Diagnosis not present

## 2025-01-09 DIAGNOSIS — N921 Excessive and frequent menstruation with irregular cycle: Secondary | ICD-10-CM | POA: Diagnosis not present

## 2025-01-09 MED ORDER — DESOGESTREL-ETHINYL ESTRADIOL 0.15-0.02/0.01 MG (21/5) PO TABS: 1.0000 | ORAL_TABLET | Freq: Every day | ORAL | 4 refills | Status: AC

## 2025-01-10 ENCOUNTER — Ambulatory Visit: Payer: Self-pay | Admitting: Certified Nurse Midwife

## 2025-01-10 ENCOUNTER — Ambulatory Visit

## 2025-01-10 LAB — CYTOLOGY - PAP: Diagnosis: NEGATIVE

## 2025-01-10 LAB — CERVICOVAGINAL ANCILLARY ONLY
Chlamydia: NEGATIVE
Comment: NEGATIVE
Comment: NEGATIVE
Comment: NORMAL
Neisseria Gonorrhea: NEGATIVE
Trichomonas: NEGATIVE

## 2025-01-10 MED ORDER — METRONIDAZOLE 500 MG PO TABS: 500.0000 mg | ORAL_TABLET | Freq: Two times a day (BID) | ORAL | 0 refills | Status: AC

## 2025-01-11 ENCOUNTER — Ambulatory Visit
